# Patient Record
Sex: Male | Born: 1985 | Race: Black or African American | Hispanic: No | Marital: Single | Smoking: Current every day smoker
Health system: Southern US, Community
[De-identification: ages and names within clinical notes are randomized; demographics above are authoritative.]

## PROBLEM LIST (undated history)

## (undated) DIAGNOSIS — M109 Gout, unspecified: Secondary | ICD-10-CM

## (undated) DIAGNOSIS — I10 Essential (primary) hypertension: Secondary | ICD-10-CM

## (undated) DIAGNOSIS — I509 Heart failure, unspecified: Secondary | ICD-10-CM

## (undated) HISTORY — DX: Gout, unspecified: M10.9

## (undated) HISTORY — DX: Heart failure, unspecified: I50.9

---

## 2012-07-05 ENCOUNTER — Encounter (HOSPITAL_BASED_OUTPATIENT_CLINIC_OR_DEPARTMENT_OTHER): Payer: Self-pay | Admitting: *Deleted

## 2012-07-05 ENCOUNTER — Emergency Department (HOSPITAL_BASED_OUTPATIENT_CLINIC_OR_DEPARTMENT_OTHER)
Admission: EM | Admit: 2012-07-05 | Discharge: 2012-07-05 | Disposition: A | Discharge status: Home / Self Care | Payer: Self-pay | Attending: Emergency Medicine | Admitting: Emergency Medicine

## 2012-07-05 DIAGNOSIS — F17200 Nicotine dependence, unspecified, uncomplicated: Secondary | ICD-10-CM | POA: Insufficient documentation

## 2012-07-05 DIAGNOSIS — L0501 Pilonidal cyst with abscess: Secondary | ICD-10-CM | POA: Insufficient documentation

## 2012-07-05 MED ORDER — LIDOCAINE HCL 2 % IJ SOLN
10.0000 mL | Freq: Once | INTRAMUSCULAR | Status: AC
  Administered 2012-07-05: 200 mg via INTRADERMAL
  Filled 2012-07-05: qty 20

## 2012-07-05 MED ORDER — HYDROCODONE-ACETAMINOPHEN 5-500 MG PO TABS: 1.0000 | ORAL_TABLET | Freq: Four times a day (QID) | ORAL | Status: DC | PRN

## 2012-07-05 MED ORDER — CEPHALEXIN 500 MG PO CAPS: 500.0000 mg | ORAL_CAPSULE | Freq: Four times a day (QID) | ORAL | Status: DC

## 2012-07-05 NOTE — ED Notes (Signed)
I & D tray is at the bedside set up and ready for the doctor to use. 

## 2012-07-05 NOTE — ED Provider Notes (Signed)
History     CSN: [id number]  Arrival date & time 07/05/12  1425   First MD Initiated Contact with Patient 07/05/12 1456      Chief Complaint  Patient presents with  . Abscess    (Consider location/radiation/quality/duration/timing/severity/associated sxs/prior treatment) HPI Matthew Buchanan is a 27 y.o. male who presents with complaint of abscess to the lower back area. States history of the same. Has had to have it drained. States this was 'years ago.'  States this time, it began 3 days ago. Worsening daily. No drainage. Tender. No fever, chills, malaise. Worse with palpation. Has not tried anything to make it better.   History reviewed. No pertinent past medical history.  History reviewed. No pertinent past surgical history.  History reviewed. No pertinent family history.  History  Substance Use Topics  . Smoking status: Current Every Day Smoker  . Smokeless tobacco: Not on file  . Alcohol Use: Yes      Review of Systems  Constitutional: Negative for fever and chills.  Respiratory: Negative.   Cardiovascular: Negative.   Skin: Positive for wound.    Allergies  Review of patient's allergies indicates no known allergies.  Home Medications  No current outpatient prescriptions on file.  BP 160/102  Pulse 84  Temp 99 F (37.2 C) (Oral)  Resp 20  SpO2 99%  Physical Exam  Nursing note and vitals reviewed. Constitutional: He appears well-developed and well-nourished. No distress.  Cardiovascular: Normal rate, regular rhythm and normal heart sounds.   Pulmonary/Chest: Effort normal and breath sounds normal. No respiratory distress. He has no wheezes. He has no rales.  Abdominal: Soft. Bowel sounds are normal. He exhibits no distension. There is no tenderness. There is no rebound.  Neurological: He is alert.  Skin: Skin is warm and dry.       3cm polinidal abscess to the lower back, tender to palpation. No drainage.   Psychiatric: He has a normal mood and affect. His  behavior is normal.    ED Course  Procedures (including critical care time)  INCISION AND DRAINAGE Performed by: Jaynie Crumble A Consent: Verbal consent obtained. Risks and benefits: risks, benefits and alternatives were discussed Type: abscess  Body area: pilonidal cyst  Anesthesia: local infiltration  Incision was made with a scalpel.  Local anesthetic: lidocaine 2% wo epinephrine  Anesthetic total: 3 ml  Complexity: complex Blunt dissection to break up loculations  Drainage: purulent  Drainage amount: large  Packing material: 1/4 in iodoform gauze  Patient tolerance: Patient tolerated the procedure well with no immediate complications.      1. Pilonidal abscess       MDM  Pt with recurrent pilonidal abscess. I&Ded in ED. Pt otherwise non toxic. Will bring back in two days for recheck. Keflex for antibiotics. Doubt MRSA.         Lottie Mussel, PA 07/05/12 1649

## 2012-07-05 NOTE — ED Notes (Signed)
Abscess to top of buttocks x 3 days

## 2012-07-06 NOTE — ED Provider Notes (Signed)
Medical screening examination/treatment/procedure(s) were performed by non-physician practitioner and as supervising physician I was immediately available for consultation/collaboration.    Nelia Shi, MD 07/06/12 2231

## 2016-03-22 ENCOUNTER — Emergency Department (HOSPITAL_BASED_OUTPATIENT_CLINIC_OR_DEPARTMENT_OTHER)
Admission: EM | Admit: 2016-03-22 | Discharge: 2016-03-22 | Disposition: A | Discharge status: Home / Self Care | Payer: Self-pay | Attending: Emergency Medicine | Admitting: Emergency Medicine

## 2016-03-22 ENCOUNTER — Encounter (HOSPITAL_BASED_OUTPATIENT_CLINIC_OR_DEPARTMENT_OTHER): Payer: Self-pay | Admitting: Emergency Medicine

## 2016-03-22 DIAGNOSIS — F17290 Nicotine dependence, other tobacco product, uncomplicated: Secondary | ICD-10-CM | POA: Insufficient documentation

## 2016-03-22 DIAGNOSIS — L0501 Pilonidal cyst with abscess: Secondary | ICD-10-CM | POA: Insufficient documentation

## 2016-03-22 MED ORDER — LIDOCAINE HCL 1 % IJ SOLN
20.0000 mL | Freq: Once | INTRAMUSCULAR | Status: AC
  Administered 2016-03-22: 20 mL

## 2016-03-22 MED ORDER — LIDOCAINE HCL (PF) 1 % IJ SOLN
30.0000 mL | Freq: Once | INTRAMUSCULAR | Status: DC
Start: 2016-03-22 — End: 2016-03-22

## 2016-03-22 MED ORDER — CEPHALEXIN 500 MG PO CAPS: 500.0000 mg | ORAL_CAPSULE | Freq: Four times a day (QID) | ORAL | 0 refills | Status: DC

## 2016-03-22 MED ORDER — LIDOCAINE HCL 1 % IJ SOLN
INTRAMUSCULAR | Status: AC
  Administered 2016-03-22: 20 mL
  Filled 2016-03-22: qty 20

## 2016-03-22 NOTE — ED Notes (Signed)
Patient is alert and oriented x3.  He was given DC instructions and follow up visit instructions.  Patient gave verbal understanding.  He was DC ambulatory under his own power to home.  V/S stable.  He was not showing any signs of distress on DC 

## 2016-03-22 NOTE — Discharge Instructions (Signed)
Please take antibiotic as prescribed. Keep wound clean and dressed. Please follow up with your pcp for wound recheck in 2-3 days. Please return to the ED if you develop fever, chills, or if the site becomes red or signs of infection.

## 2016-03-22 NOTE — ED Triage Notes (Signed)
Pt reports abscess on the top of his buttocks. Denies fever or drainage.

## 2016-03-22 NOTE — ED Provider Notes (Signed)
[facility] Provider Note   CSN: [id number] Arrival date & time: 03/22/16    By signing my name below, I, Modena Jansky, attest that this documentation has been prepared under the direction and in the presence of non-physician practitioner, Rise Mu, PA-C. Electronically Signed: Modena Jansky, Scribe. 03/22/2016. 5:11 PM.  History   Chief Complaint Chief Complaint  Patient presents with  . Abscess   The history is provided by the patient. No language interpreter was used.   HPI Comments: Matthew Buchanan is a 30 y.o. male who presents to the Emergency Department complaining of a bump that started 2 days ago. He states that the bump worsened since initial onset and ruptured recently. He reports associated symptoms of pain and swelling to bump site. Patient with history of pilondonial cyst. He has had to have it drained in the past. Started draining prior to arrival.  Tender to palpation or movement. He has tried nothing for the pain. He describes the pain as constant and moderate. He denies any fever, chills, malaise or other complaints.    History reviewed. No pertinent past medical history.  There are no active problems to display for this patient.   History reviewed. No pertinent surgical history.     Home Medications    Prior to Admission medications   Medication Sig Start Date End Date Taking? Authorizing Provider  cephALEXin (KEFLEX) 500 MG capsule Take 1 capsule (500 mg total) by mouth 4 (four) times daily. 07/05/12   Tatyana Kirichenko, PA-C  HYDROcodone-acetaminophen (VICODIN) 5-500 MG per tablet Take 1-2 tablets by mouth every 6 (six) hours as needed for pain. 07/05/12   Jaynie Crumble, PA-C    Family History No family history on file.  Social History Social History  Substance Use Topics  . Smoking status: Current Every Day Smoker    Types: Cigars  . Smokeless tobacco: Never Used  . Alcohol use Yes     Allergies   Review of patient's  allergies indicates no known allergies.   Review of Systems Review of Systems  Constitutional: Negative for chills and fever.  Skin: Positive for wound. Negative for rash.  All other systems reviewed and are negative.    Physical Exam Updated Vital Signs BP 154/100   Pulse 102   Temp 98.5 F (36.9 C) (Oral)   Resp 20   Ht 6' (1.829 m)   Wt 267 lb (121.1 kg)   SpO2 100%   BMI 36.21 kg/m   Physical Exam  Constitutional: He appears well-developed and well-nourished. No distress.  HENT:  Head: Normocephalic and atraumatic.  Eyes: Conjunctivae are normal.  Neck: Neck supple.  Cardiovascular: Normal rate.   Pulmonary/Chest: Effort normal.  Abdominal: Soft.  Musculoskeletal: Normal range of motion.  Neurological: He is alert.  Skin: Skin is warm and dry. Capillary refill takes less than 2 seconds.  3cm polinidal abscess to the lower back at the top of the right gluteal fold, tender to palpation. Purulent drainage noted. No erythema noted. No signs of infection. Induration without fluctuance.   Psychiatric: He has a normal mood and affect.  Nursing note and vitals reviewed.    ED Treatments / Results  DIAGNOSTIC STUDIES: Oxygen Saturation is 100% on RA, normal by my interpretation.    COORDINATION OF CARE: 5:15 PM- Pt advised of plan for treatment and pt agrees.  Labs (all labs ordered are listed, but only abnormal results are displayed) Labs Reviewed - No data to display  EKG  EKG Interpretation  None       Radiology No results found.  Procedures .Marland Kitchen.Incision and Drainage Date/Time: 03/23/2016 2:06 AM Performed by: Demetrios LollLEAPHART, KENNETH T Authorized by: Demetrios LollLEAPHART, KENNETH T   Consent:    Consent obtained:  Verbal   Consent given by:  Patient   Risks discussed:  Bleeding, incomplete drainage, pain and infection   Alternatives discussed:  No treatment Location:    Type:  Pilonidal cyst   Size:  2cm   Location: gluteal fold. Pre-procedure details:    Skin  preparation:  Betadine Anesthesia (see MAR for exact dosages):    Anesthesia method:  Local infiltration   Local anesthetic:  Lidocaine 1% w/o epi Procedure type:    Complexity:  Simple Procedure details:    Needle aspiration: no     Incision types:  Single straight   Incision depth:  Subcutaneous   Scalpel blade:  11   Wound management:  Probed and deloculated and irrigated with saline   Drainage:  Serous (No purluent discharge was expressed)   Drainage amount:  Scant   Wound treatment:  Wound left open   Packing materials:  None Post-procedure details:    Patient tolerance of procedure:  Tolerated well, no immediate complications   (including critical care time)  Medications Ordered in ED Medications  lidocaine (XYLOCAINE) 1 % (with pres) injection 20 mL (20 mLs Infiltration Given by Other 03/22/16 1804)     Initial Impression / Assessment and Plan / ED Course  I have reviewed the triage vital signs and the nursing notes.  Pertinent labs & imaging results that were available during my care of the patient were reviewed by me and considered in my medical decision making (see chart for details).  Clinical Course  Patient with pilodonial cyst amenable to incision and drainage.  Abscess was not large enough to warrant packing or drain,  wound recheck in 2 days. Encouraged home warm soaks and flushing. Started patient on keflex. Doubt MRSA. Patient with drainage prior to arrival. Induration noted. However, unable to express anymore purulent discharge. Patient is afebrile. Will d/c to home. Patient verbalized Understanding the plan of care. Blood pressure slightly elevated in ED. History of same. Patient encouraged follow-up with primary care doctor. Hemodynamically stable. Discharged home in no acute distress stable vital signs.  Final Clinical Impressions(s) / ED Diagnoses   Final diagnoses:  Pilonidal abscess    New Prescriptions Discharge Medication List as of 03/22/2016   6:02 PM     I personally performed the services described in this documentation, which was scribed in my presence. The recorded information has been reviewed and is accurate.     Rise MuKenneth T Leaphart, PA-C 03/23/16 [id number]    Loren Raceravid Yelverton, MD 03/29/16 [phone]

## 2018-10-27 ENCOUNTER — Emergency Department (HOSPITAL_BASED_OUTPATIENT_CLINIC_OR_DEPARTMENT_OTHER): Payer: Self-pay

## 2018-10-27 ENCOUNTER — Other Ambulatory Visit: Payer: Self-pay

## 2018-10-27 ENCOUNTER — Encounter (HOSPITAL_BASED_OUTPATIENT_CLINIC_OR_DEPARTMENT_OTHER): Payer: Self-pay | Admitting: Emergency Medicine

## 2018-10-27 ENCOUNTER — Emergency Department (HOSPITAL_BASED_OUTPATIENT_CLINIC_OR_DEPARTMENT_OTHER)
Admission: EM | Admit: 2018-10-27 | Discharge: 2018-10-27 | Disposition: A | Discharge status: Home / Self Care | Payer: Self-pay | Attending: Emergency Medicine | Admitting: Emergency Medicine

## 2018-10-27 DIAGNOSIS — F17290 Nicotine dependence, other tobacco product, uncomplicated: Secondary | ICD-10-CM | POA: Insufficient documentation

## 2018-10-27 DIAGNOSIS — I10 Essential (primary) hypertension: Secondary | ICD-10-CM | POA: Insufficient documentation

## 2018-10-27 DIAGNOSIS — Z79899 Other long term (current) drug therapy: Secondary | ICD-10-CM | POA: Insufficient documentation

## 2018-10-27 DIAGNOSIS — R079 Chest pain, unspecified: Secondary | ICD-10-CM | POA: Insufficient documentation

## 2018-10-27 LAB — BASIC METABOLIC PANEL
Anion gap: 8 (ref 5–15)
BUN: 11 mg/dL (ref 6–20)
CO2: 26 mmol/L (ref 22–32)
Calcium: 9.1 mg/dL (ref 8.9–10.3)
Chloride: 107 mmol/L (ref 98–111)
Creatinine, Ser: 1.14 mg/dL (ref 0.61–1.24)
GFR calc Af Amer: >60 mL/min (ref >60)
GFR calc non Af Amer: >60 mL/min (ref >60)
Glucose, Bld: 100 mg/dL — ABNORMAL HIGH (ref 70–99)
Potassium: 3.5 mmol/L (ref 3.5–5.1)
Sodium: 141 mmol/L (ref 135–145)

## 2018-10-27 LAB — CBC
HCT: 45.8 % (ref 39.0–52.0)
Hemoglobin: 15.2 g/dL (ref 13.0–17.0)
MCH: 31 pg (ref 26.0–34.0)
MCHC: 33.2 g/dL (ref 30.0–36.0)
MCV: 93.3 fL (ref 80.0–100.0)
Platelets: 212 10*3/uL (ref 150–400)
RBC: 4.91 MIL/uL (ref 4.22–5.81)
RDW: 12.8 % (ref 11.5–15.5)
WBC: 5.3 10*3/uL (ref 4.0–10.5)
nRBC: 0 % (ref 0.0–0.2)

## 2018-10-27 LAB — CBG MONITORING, ED: Glucose-Capillary: 97 mg/dL (ref 70–99)

## 2018-10-27 LAB — TROPONIN I: Troponin I: <0.03 ng/mL (ref <0.03)

## 2018-10-27 MED ORDER — ASPIRIN 81 MG PO CHEW
324.0000 mg | CHEWABLE_TABLET | Freq: Once | ORAL | Status: AC
  Administered 2018-10-27: 324 mg via ORAL
  Filled 2018-10-27: qty 4

## 2018-10-27 MED ORDER — HYDROCHLOROTHIAZIDE 25 MG PO TABS: 25.0000 mg | ORAL_TABLET | Freq: Every day | ORAL | 0 refills | Status: DC

## 2018-10-27 MED ORDER — ALBUTEROL SULFATE HFA 108 (90 BASE) MCG/ACT IN AERS
2.0000 | INHALATION_SPRAY | Freq: Once | RESPIRATORY_TRACT | Status: AC
  Administered 2018-10-27: 2 via RESPIRATORY_TRACT
  Filled 2018-10-27: qty 6.7

## 2018-10-27 NOTE — ED Notes (Signed)
ED Provider at bedside. 

## 2018-10-27 NOTE — Discharge Instructions (Addendum)
Start taking medication for your blood pressure.  Follow-up with a primary care doctor to have that rechecked.  Return to the emergency room for any worsening symptoms.

## 2018-10-27 NOTE — ED Triage Notes (Signed)
Pt c/o mid sternal chest pain and upper back pain x 3/4 days. Pt also c/o sob. Denies fever, N/V/D

## 2018-10-27 NOTE — ED Notes (Signed)
X-ray at bedside

## 2018-10-27 NOTE — ED Notes (Signed)
Pt instructed on albuterol  Inhaler. Pt verbalized understanding. Pt monitored when taking medication.

## 2018-10-27 NOTE — ED Notes (Signed)
Pt c/o central chest pain with radiation to back x 3/4 days. Denies fever, N/V/D

## 2018-10-27 NOTE — ED Provider Notes (Signed)
[facility] EMERGENCY DEPARTMENT Provider Note   CSN: [id number] Arrival date & time: 10/27/18  1041    History   Chief Complaint Chief Complaint  Patient presents with  . Chest Pain    HPI Temitayo Covalt is a 33 y.o. male.  HPI: A 33 year old patient with a history of hypertension presents for evaluation of chest pain. Initial onset of pain was more than 6 hours ago. The patient's chest pain is sharp and is not worse with exertion. The patient's chest pain is middle- or left-sided, is not well-localized, is not described as heaviness/pressure/tightness and does not radiate to the arms/jaw/neck. The patient does not complain of nausea and denies diaphoresis. The patient has smoked in the past 90 days. The patient has no history of stroke, has no history of peripheral artery disease, denies any history of treated diabetes, has no relevant family history of coronary artery disease (first degree relative at less than age 32), has no history of hypercholesterolemia and does not have an elevated BMI (>=30).   HPI Patient presents the emergency room for evaluation of chest pain.  Patient states started having pain in his chest yesterday.  Is a sharp pain that increases with deep breathing and it does radiate to his back.  He does not have any history of heart disease.  No history of PE or DVT.  Patient does smoke.  He has been told that he had elevated blood pressure in the past.  Patient states he was given a prescription.  He states he never went back to see another doctor after the prescription ran out. History reviewed. No pertinent past medical history.  There are no active problems to display for this patient.   History reviewed. No pertinent surgical history.      Home Medications    Prior to Admission medications   Medication Sig Start Date End Date Taking? Authorizing Provider  cephALEXin (KEFLEX) 500 MG capsule Take 1 capsule (500 mg total) by mouth 4 (four) times daily.  03/22/16   Rise Mu, PA-C  hydrochlorothiazide (HYDRODIURIL) 25 MG tablet Take 1 tablet (25 mg total) by mouth daily. 10/27/18   Linwood Dibbles, MD  HYDROcodone-acetaminophen (VICODIN) 5-500 MG per tablet Take 1-2 tablets by mouth every 6 (six) hours as needed for pain. 07/05/12   Jaynie Crumble, PA-C    Family History History reviewed. No pertinent family history.  Social History Social History   Tobacco Use  . Smoking status: Current Every Day Smoker    Types: Cigars  . Smokeless tobacco: Never Used  Substance Use Topics  . Alcohol use: Yes  . Drug use: Yes    Types: Marijuana     Allergies   Patient has no known allergies.   Review of Systems Review of Systems  All other systems reviewed and are negative.    Physical Exam Updated Vital Signs BP (!) 146/93 (BP Location: Left Arm)   Pulse 60   Resp 18   Ht 1.829 m (6')   Wt 119.7 kg   SpO2 98%   BMI 35.80 kg/m   Physical Exam Vitals signs and nursing note reviewed.  Constitutional:      General: He is not in acute distress.    Appearance: He is well-developed.  HENT:     Head: Normocephalic and atraumatic.     Right Ear: External ear normal.     Left Ear: External ear normal.  Eyes:     General: No scleral icterus.  Right eye: No discharge.        Left eye: No discharge.     Conjunctiva/sclera: Conjunctivae normal.  Neck:     Musculoskeletal: Neck supple.     Trachea: No tracheal deviation.  Cardiovascular:     Rate and Rhythm: Normal rate and regular rhythm.  Pulmonary:     Effort: Pulmonary effort is normal. No respiratory distress.     Breath sounds: No stridor. Wheezing present. No rales.  Abdominal:     General: Bowel sounds are normal. There is no distension.     Palpations: Abdomen is soft.     Tenderness: There is no abdominal tenderness. There is no guarding or rebound.  Musculoskeletal:        General: No tenderness.  Skin:    General: Skin is warm and dry.      Findings: No rash.  Neurological:     Mental Status: He is alert.     Cranial Nerves: No cranial nerve deficit (no facial droop, extraocular movements intact, no slurred speech).     Sensory: No sensory deficit.     Motor: No abnormal muscle tone or seizure activity.     Coordination: Coordination normal.      ED Treatments / Results  Labs (all labs ordered are listed, but only abnormal results are displayed) Labs Reviewed  BASIC METABOLIC PANEL - Abnormal; Notable for the following components:      Result Value   Glucose, Bld 100 (*)    All other components within normal limits  TROPONIN I  CBC  TROPONIN I  CBG MONITORING, ED    EKG EKG Interpretation  Date/Time:  Tuesday Oct 27 2018 10:55:09 EDT Ventricular Rate:  85 PR Interval:    QRS Duration: 88 QT Interval:  383 QTC Calculation: 456 R Axis:   27 Text Interpretation:  Sinus rhythm Abnormal T, consider ischemia, diffuse leads Minimal ST elevation, anterior leads No old tracing to compare Confirmed by Linwood Dibbles [phone]) on 10/27/2018 10:58:05 AM   Radiology Dg Chest Portable 1 View  Result Date: 10/27/2018 CLINICAL DATA:  Midsternal chest pain. EXAM: PORTABLE CHEST 1 VIEW FINDINGS: The heart size and mediastinal contours are within normal limits. Both lungs are clear. The visualized skeletal structures are unremarkable. IMPRESSION: No active disease. Electronically Signed   By: Elsie Stain M.D.   On: 10/27/2018 11:19    Procedures Procedures (including critical care time)  Medications Ordered in ED Medications  aspirin chewable tablet 324 mg (324 mg Oral Given 10/27/18 1147)  albuterol (VENTOLIN HFA) 108 (90 Base) MCG/ACT inhaler 2 puff (2 puffs Inhalation Given 10/27/18 1146)     Initial Impression / Assessment and Plan / ED Course  I have reviewed the triage vital signs and the nursing notes.  Pertinent labs & imaging results that were available during my care of the patient were reviewed by me and  considered in my medical decision making (see chart for details).  Clinical Course as of Oct 27 1502  Tue Oct 27, 2018  1410 Low risk for PE.  Perc negative.   [JK]  1500 Plan was for delta troponin.  Pt has to leave and pick up his daughter.   [JK]    Clinical Course User Index [JK] Linwood Dibbles, MD    HEAR Score: 2Patient presented to the emergency room for evaluation of chest pain.  Patient is low risk for heart disease.  Initial troponin was negative.  I recommended a delta troponin but the patient  had to leave.  Overall I have low suspicion as the symptoms did start more than 24 hours ago.  Patient symptoms are more pleuritic in nature but he is not short of breath and is PERC negative.  I doubt pulmonary embolism.  Symptoms may be related to a viral pleurisy.  Patient was noted to be hypertensive.  I will discharge home on a low-dose antihypertensive agent.  Discussed outpatient follow-up with her primary care doctor.  Final Clinical Impressions(s) / ED Diagnoses   Final diagnoses:  Chest pain, unspecified type  Hypertension, unspecified type    ED Discharge Orders         Ordered    hydrochlorothiazide (HYDRODIURIL) 25 MG tablet  Daily     10/27/18 1504           Linwood DibblesKnapp, Ashantae Pangallo, MD 10/27/18 1504

## 2018-10-27 NOTE — ED Notes (Signed)
cbg 97 

## 2020-06-03 HISTORY — PX: CHOLECYSTECTOMY: SHX55

## 2021-01-11 IMAGING — DX PORTABLE CHEST - 1 VIEW
1 series · 1 of 1 positions shown · non-contrast
Comparison: none

CLINICAL DATA: Midsternal chest pain.

EXAM:
PORTABLE CHEST 1 VIEW

[chest ap]
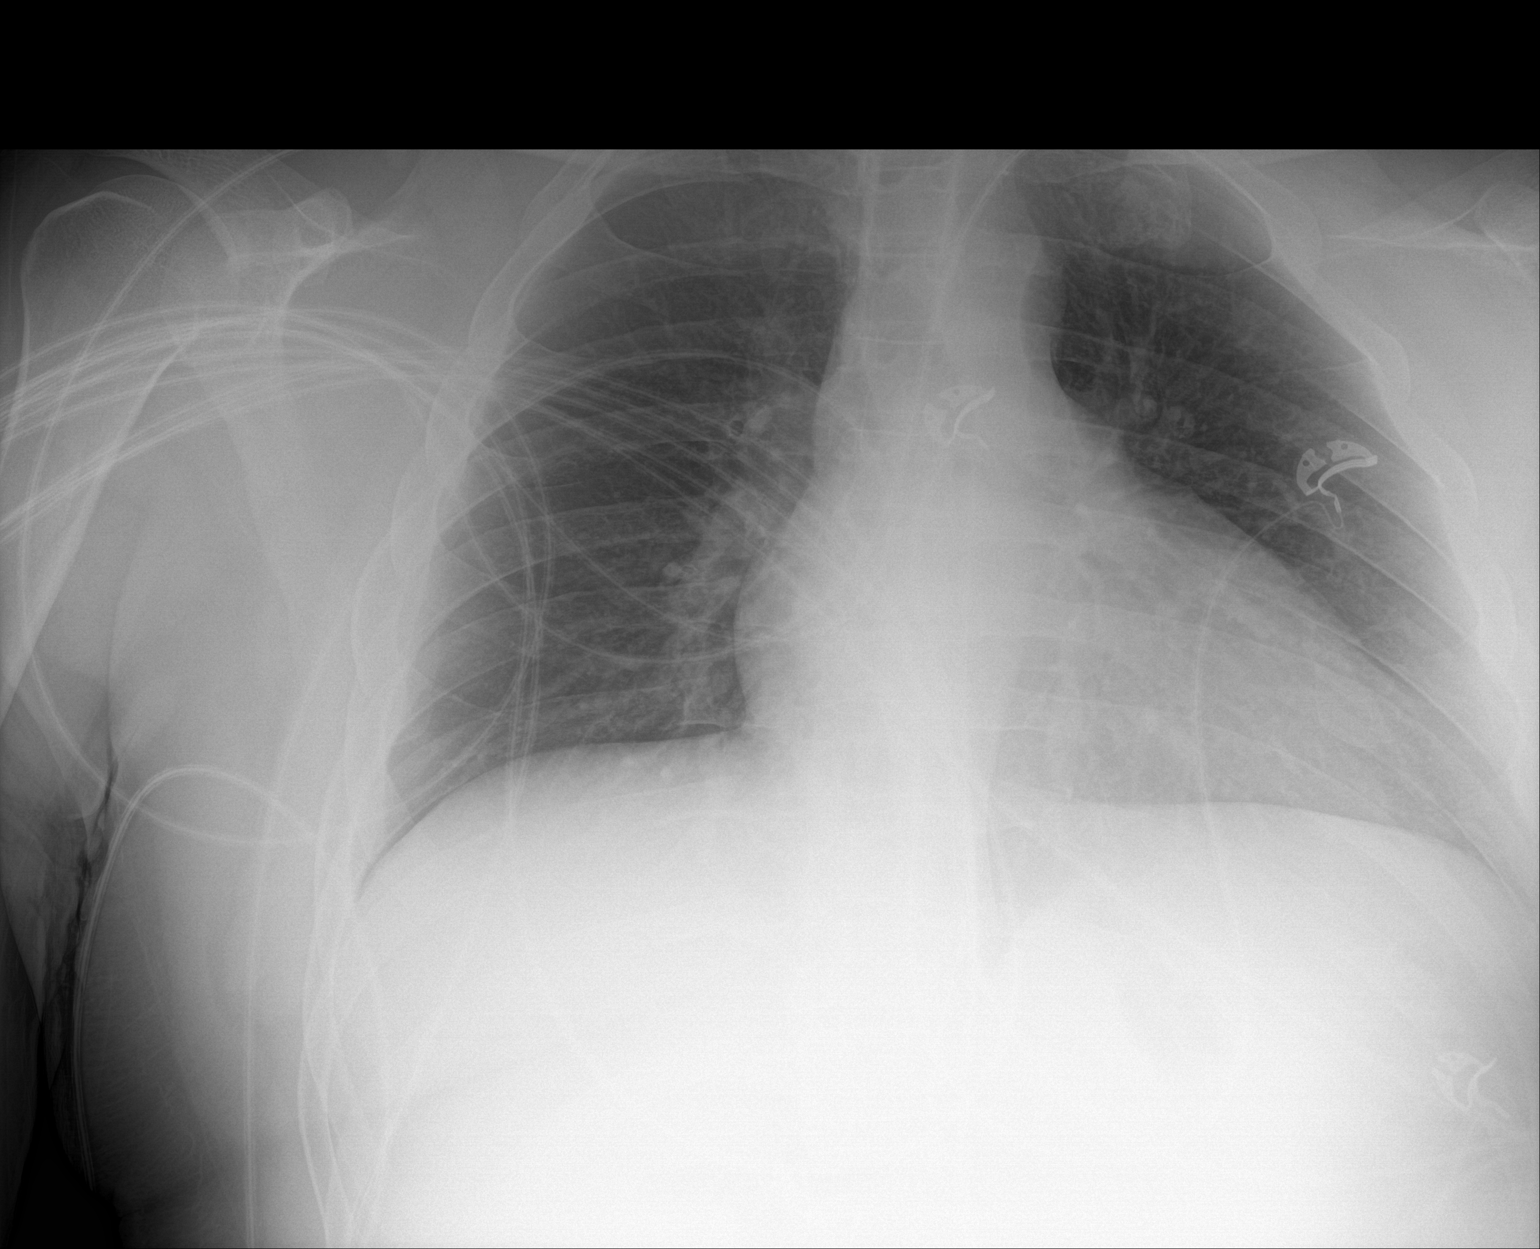

[1 of 1 positions shown; findings below may reference images not displayed]

FINDINGS: The heart size and mediastinal contours are within normal limits.
Both lungs are clear. The visualized skeletal structures are
unremarkable.
IMPRESSION: No active disease.

## 2022-11-19 ENCOUNTER — Encounter (HOSPITAL_BASED_OUTPATIENT_CLINIC_OR_DEPARTMENT_OTHER): Payer: Self-pay | Admitting: Urology

## 2022-11-19 ENCOUNTER — Other Ambulatory Visit: Payer: Self-pay

## 2022-11-19 ENCOUNTER — Emergency Department (HOSPITAL_BASED_OUTPATIENT_CLINIC_OR_DEPARTMENT_OTHER)
Admission: EM | Admit: 2022-11-19 | Discharge: 2022-11-19 | Disposition: A | Discharge status: Home / Self Care | Payer: 59 | Attending: Emergency Medicine | Admitting: Emergency Medicine

## 2022-11-19 ENCOUNTER — Emergency Department (HOSPITAL_BASED_OUTPATIENT_CLINIC_OR_DEPARTMENT_OTHER): Payer: 59

## 2022-11-19 DIAGNOSIS — S3992XA Unspecified injury of lower back, initial encounter: Secondary | ICD-10-CM | POA: Insufficient documentation

## 2022-11-19 DIAGNOSIS — I509 Heart failure, unspecified: Secondary | ICD-10-CM | POA: Diagnosis not present

## 2022-11-19 DIAGNOSIS — W19XXXA Unspecified fall, initial encounter: Secondary | ICD-10-CM | POA: Diagnosis not present

## 2022-11-19 DIAGNOSIS — M533 Sacrococcygeal disorders, not elsewhere classified: Secondary | ICD-10-CM | POA: Diagnosis present

## 2022-11-19 MED ORDER — HYDROCODONE-ACETAMINOPHEN 5-325 MG PO TABS: 1.0000 | ORAL_TABLET | Freq: Four times a day (QID) | ORAL | 0 refills | Status: DC | PRN

## 2022-11-19 MED ORDER — KETOROLAC TROMETHAMINE 15 MG/ML IJ SOLN
15.0000 mg | Freq: Once | INTRAMUSCULAR | Status: AC
  Administered 2022-11-19: 15 mg via INTRAMUSCULAR
  Filled 2022-11-19: qty 1

## 2022-11-19 NOTE — ED Provider Notes (Signed)
[place] [facility] Provider Note   CSN: [id number] Arrival date & time: 11/19/22  1334     History  Chief Complaint  Patient presents with   Tailbone Pain    Money Etling is a 37 y.o. male.  Patient presents to the emergency department today for evaluation of tailbone pain.  Patient had a fall onto his bottom about 1 week ago.  Since that time he has had pain with bending and with sitting.  He has a history of congestive heart failure.  No anticoagulation.  Denies treatments prior to arrival.  No weakness, numbness, or tingling in the legs.  He is ambulatory.  He is able to sit but it is very uncomfortable.       Home Medications Prior to Admission medications   Medication Sig Start Date End Date Taking? Authorizing Provider  HYDROcodone-acetaminophen (NORCO/VICODIN) 5-325 MG tablet Take 1 tablet by mouth every 6 (six) hours as needed for severe pain. 11/19/22  Yes Renne Crigler, PA-C  albuterol (VENTOLIN HFA) 108 (90 Base) MCG/ACT inhaler Inhale 2 puffs into the lungs every 6 (six) hours as needed for wheezing or shortness of breath.    [provider]  cephALEXin (KEFLEX) 500 MG capsule Take 1 capsule (500 mg total) by mouth 4 (four) times daily. 03/22/16   Rise Mu, PA-C  hydrochlorothiazide (HYDRODIURIL) 25 MG tablet Take 1 tablet (25 mg total) by mouth daily. 10/27/18   Linwood Dibbles, MD      Allergies    Patient has no known allergies.    Review of Systems   Review of Systems  Physical Exam Updated Vital Signs BP (!) 158/102 (BP Location: Left Arm)   Pulse 87   Temp 97.8 F (36.6 C)   Resp 18   Ht 6' (1.829 m)   Wt (!) 137.4 kg   SpO2 98%   BMI 41.09 kg/m  Physical Exam Vitals and nursing note reviewed.  Constitutional:      Appearance: He is well-developed.  HENT:     Head: Normocephalic and atraumatic.  Eyes:     Conjunctiva/sclera: Conjunctivae normal.  Pulmonary:     Effort: No respiratory  distress.  Musculoskeletal:     Cervical back: Normal range of motion and neck supple.     Lumbar back: Tenderness present.       Back:     Comments: Patient with tenderness to palpation over the coccygeal area.  No signs of cyst or abscess.  No skin changes.  No palpable deformity.  Skin:    General: Skin is warm and dry.  Neurological:     Mental Status: He is alert.     ED Results / Procedures / Treatments   Labs (all labs ordered are listed, but only abnormal results are displayed) Labs Reviewed - No data to display  EKG None  Radiology DG Sacrum/Coccyx  Result Date: 11/19/2022 CLINICAL DATA:  Trauma, fall, pain EXAM: SACRUM AND COCCYX - 2+ VIEW COMPARISON:  None Available. FINDINGS: No fracture is seen.  SI joints are symmetrical. IMPRESSION: No fracture is seen. Electronically Signed   By: Ernie Avena M.D.   On: 11/19/2022 15:15    Procedures Procedures    Medications Ordered in ED Medications  ketorolac (TORADOL) 15 MG/ML injection 15 mg (has no administration in time range)    ED Course/ Medical Decision Making/ A&P    Patient seen and examined. History obtained directly from patient. Work-up including labs, imaging, EKG  ordered in triage, if performed, were reviewed.    Labs/EKG: None ordered  Imaging: Independently reviewed and interpreted.  This included: Sacral/coccyx ischial plain films ordered in triage, agree negative.  Medications/Fluids: Will give 1 dose of 15 mg IV Toradol here (creatinine 1.05 on 06/12/2022).  I would like to avoid long-term NSAIDs due to his CHF history.  He will be given pain medication for home.  Most recent vital signs reviewed and are as follows: BP (!) 158/102 (BP Location: Left Arm)   Pulse 87   Temp 97.8 F (36.6 C)   Resp 18   Ht 6' (1.829 m)   Wt (!) 137.4 kg   SpO2 98%   BMI 41.09 kg/m   Initial impression: Coccygeal pain after fall  Home treatment plan: Ice and heat on the area, donut pillow, p.o.  Tylenol for mild pain.  Will prescribe # 5 tablets of Vicodin 5 mg - 325 mg for more severe pain.  Advised to use at bedtime.  Patient counseled on use of narcotic pain medications. Counseled not to combine these medications with others containing tylenol. Urged not to drink alcohol, drive, or perform any other activities that requires focus while taking these medications. The patient verbalizes understanding and agrees with the plan.  Return instructions discussed with patient: New or worsening symptoms  Follow-up instructions discussed with patient: PCP in 1 week for recheck                            Medical Decision Making Amount and/or Complexity of Data Reviewed Radiology: ordered.  Risk Prescription drug management.   Patient with coccygeal pain.  X-rays negative.  No sign of gluteal cleft cyst or pilonidal cyst.  Patient is ambulatory.  Lower extremities are neurovascularly intact.  He is not a great candidate for long-term NSAIDs due to his heart failure history.  He was given a IM dose of Toradol here and will be discharged home with some pain medication.  The patient's vital signs, pertinent lab work and imaging were reviewed and interpreted as discussed in the ED course. Hospitalization was considered for further testing, treatments, or serial exams/observation. However as patient is well-appearing, has a stable exam, and reassuring studies today, I do not feel that they warrant admission at this time. This plan was discussed with the patient who verbalizes agreement and comfort with this plan and seems reliable and able to return to the Emergency Department with worsening or changing symptoms.          Final Clinical Impression(s) / ED Diagnoses Final diagnoses:  Injury of coccyx, initial encounter    Rx / DC Orders ED Discharge Orders          Ordered    HYDROcodone-acetaminophen (NORCO/VICODIN) 5-325 MG tablet  Every 6 hours PRN        11/19/22 1605               Renne Crigler, PA-C 11/19/22 1610    Tegeler, Canary Brim, MD 11/19/22 1730

## 2022-11-19 NOTE — ED Notes (Signed)
Patient transported to X-ray 

## 2022-11-19 NOTE — Discharge Instructions (Signed)
Please read and follow all provided instructions.  Your diagnoses today include:  1. Injury of coccyx, initial encounter    Tests performed today include: X-ray of the sacrum and coccyx did not show any definite fractures Vital signs. See below for your results today.   Medications prescribed:  Vicodin (hydrocodone/acetaminophen) - narcotic pain medication  DO NOT drive or perform any activities that require you to be awake and alert because this medicine can make you drowsy. BE VERY CAREFUL not to take multiple medicines containing Tylenol (also called acetaminophen). Doing so can lead to an overdose which can damage your liver and cause liver failure and possibly death.  Take any prescribed medications only as directed.  Home care instructions:  Follow any educational materials contained in this packet.  BE VERY CAREFUL not to take multiple medicines containing Tylenol (also called acetaminophen). Doing so can lead to an overdose which can damage your liver and cause liver failure and possibly death.   Follow-up instructions: Please follow-up with your primary care provider in the next 7 days for further evaluation of your symptoms.   Return instructions:  Please return to the Emergency Department if you experience worsening symptoms.  Please return if you have any other emergent concerns.  Additional Information:  Your vital signs today were: BP (!) 158/102 (BP Location: Left Arm)   Pulse 87   Temp 97.8 F (36.6 C)   Resp 18   Ht 6' (1.829 m)   Wt (!) 137.4 kg   SpO2 98%   BMI 41.09 kg/m  If your blood pressure (BP) was elevated above 135/85 this visit, please have this repeated by your doctor within one month. --------------

## 2022-11-19 NOTE — ED Triage Notes (Signed)
Pt states coccyx pain after fall 1 week ago  Pain worse with sitting

## 2023-01-06 ENCOUNTER — Emergency Department (HOSPITAL_BASED_OUTPATIENT_CLINIC_OR_DEPARTMENT_OTHER)
Admission: EM | Admit: 2023-01-06 | Discharge: 2023-01-06 | Disposition: A | Discharge status: Home / Self Care | Payer: 59 | Attending: Emergency Medicine | Admitting: Emergency Medicine

## 2023-01-06 ENCOUNTER — Emergency Department (HOSPITAL_BASED_OUTPATIENT_CLINIC_OR_DEPARTMENT_OTHER): Payer: 59

## 2023-01-06 ENCOUNTER — Encounter (HOSPITAL_BASED_OUTPATIENT_CLINIC_OR_DEPARTMENT_OTHER): Payer: Self-pay

## 2023-01-06 ENCOUNTER — Other Ambulatory Visit: Payer: Self-pay

## 2023-01-06 DIAGNOSIS — M25571 Pain in right ankle and joints of right foot: Secondary | ICD-10-CM | POA: Diagnosis present

## 2023-01-06 DIAGNOSIS — X58XXXA Exposure to other specified factors, initial encounter: Secondary | ICD-10-CM | POA: Insufficient documentation

## 2023-01-06 DIAGNOSIS — S93491A Sprain of other ligament of right ankle, initial encounter: Secondary | ICD-10-CM | POA: Insufficient documentation

## 2023-01-06 HISTORY — DX: Essential (primary) hypertension: I10

## 2023-01-06 MED ORDER — KETOROLAC TROMETHAMINE 15 MG/ML IJ SOLN
15.0000 mg | Freq: Once | INTRAMUSCULAR | Status: AC
  Administered 2023-01-06: 15 mg via INTRAMUSCULAR
  Filled 2023-01-06: qty 1

## 2023-01-06 NOTE — Discharge Instructions (Signed)
There is no obvious broken bone on your x-ray.  Please try and keep your weight off of it at all times.  Please follow-up with your family doctor in the office.  Take 4 over the counter ibuprofen tablets 3 times a day or 2 over-the-counter naproxen tablets twice a day for pain. Also take tylenol 1000mg (2 extra strength) four times a day.

## 2023-01-06 NOTE — ED Triage Notes (Signed)
Pt states he woke up Sunday morning with rt. Foot swelling.   Denies any injury Can not bear weight on foot

## 2023-01-06 NOTE — ED Notes (Signed)
Pt refused crutches states he has a pair at home. RN Drinda Butts informed

## 2023-01-06 NOTE — ED Provider Notes (Signed)
[place] [facility] Provider Note   CSN: [id number] Arrival date & time: 01/06/23  0008     History  Chief Complaint  Patient presents with   rt. foot swelling    Worden Schults is a 37 y.o. male.  37 yo M with a chief complaints of right ankle pain.  This was noticed yesterday morning after he woke up.  Denies any obvious injury.  Pain with bearing weight.  Denies obvious trauma.  No pain to the knee.        Home Medications Prior to Admission medications   Medication Sig Start Date End Date Taking? Authorizing Provider  albuterol (VENTOLIN HFA) 108 (90 Base) MCG/ACT inhaler Inhale 2 puffs into the lungs every 6 (six) hours as needed for wheezing or shortness of breath.    [provider]  cephALEXin (KEFLEX) 500 MG capsule Take 1 capsule (500 mg total) by mouth 4 (four) times daily. 03/22/16   Rise Mu, PA-C  hydrochlorothiazide (HYDRODIURIL) 25 MG tablet Take 1 tablet (25 mg total) by mouth daily. 10/27/18   Linwood Dibbles, MD  HYDROcodone-acetaminophen (NORCO/VICODIN) 5-325 MG tablet Take 1 tablet by mouth every 6 (six) hours as needed for severe pain. 11/19/22   Renne Crigler, PA-C      Allergies    Patient has no known allergies.    Review of Systems   Review of Systems  Physical Exam Updated Vital Signs BP (!) 145/93 (BP Location: Left Arm)   Pulse 86   Temp 97.9 F (36.6 C)   Resp 18   Ht 6' (1.829 m)   Wt 128.8 kg   SpO2 95%   BMI 38.52 kg/m  Physical Exam Vitals and nursing note reviewed.  Constitutional:      Appearance: He is well-developed.  HENT:     Head: Normocephalic and atraumatic.  Eyes:     Pupils: Pupils are equal, round, and reactive to light.  Neck:     Vascular: No JVD.  Cardiovascular:     Rate and Rhythm: Normal rate and regular rhythm.     Heart sounds: No murmur heard.    No friction rub. No gallop.  Pulmonary:     Effort: No respiratory distress.     Breath sounds: No  wheezing.  Abdominal:     General: There is no distension.     Tenderness: There is no abdominal tenderness. There is no guarding or rebound.  Musculoskeletal:        General: Swelling and tenderness present. Normal range of motion.     Cervical back: Normal range of motion and neck supple.     Comments: Pain and swelling to the right ankle about the anterior tib fibular ligament.  No obvious pain about the medial aspect of the foot or ankle.  No pain about the navicular at the base of the fifth metatarsal.  Pulse motor and sensation intact.  Skin:    Coloration: Skin is not pale.     Findings: No rash.  Neurological:     Mental Status: He is alert and oriented to person, place, and time.  Psychiatric:        Behavior: Behavior normal.     ED Results / Procedures / Treatments   Labs (all labs ordered are listed, but only abnormal results are displayed) Labs Reviewed - No data to display  EKG None  Radiology No results found.  Procedures Procedures    Medications Ordered in ED Medications  ketorolac (TORADOL) 15 MG/ML injection 15 mg (15 mg Intramuscular Given 01/06/23 0035)    ED Course/ Medical Decision Making/ A&P                                 Medical Decision Making Amount and/or Complexity of Data Reviewed Radiology: ordered.  Risk Prescription drug management.   37 yo M with a chief complaints of right ankle pain.  Most likely the patient has an ankle sprain based on exam.  He has no obvious history of ankle injury that he can remember.  Will obtain a plain film.  Unlikely to be septic arthritis or gout based on exam and history.  Plain film of the ankle independently interpreted by me without fracture.  Will place in a splint.  Crutches.  PCP follow-up.  1:17 AM:  I have discussed the diagnosis/risks/treatment options with the patient and family.  Evaluation and diagnostic testing in the emergency department does not suggest an emergent condition requiring  admission or immediate intervention beyond what has been performed at this time.  They will follow up with PCP. We also discussed returning to the ED immediately if new or worsening sx occur. We discussed the sx which are most concerning (e.g., sudden worsening pain, fever, inability to tolerate by mouth) that necessitate immediate return. Medications administered to the patient during their visit and any new prescriptions provided to the patient are listed below.  Medications given during this visit Medications  ketorolac (TORADOL) 15 MG/ML injection 15 mg (15 mg Intramuscular Given 01/06/23 0035)     The patient appears reasonably screen and/or stabilized for discharge and I doubt any other medical condition or other [facility] requiring further screening, evaluation, or treatment in the ED at this time prior to discharge.          Final Clinical Impression(s) / ED Diagnoses Final diagnoses:  Sprain of anterior talofibular ligament of right ankle, initial encounter    Rx / DC Orders ED Discharge Orders     None         Melene Plan, DO 01/06/23 0117

## 2023-04-11 ENCOUNTER — Other Ambulatory Visit: Payer: Self-pay

## 2023-04-11 ENCOUNTER — Encounter (HOSPITAL_BASED_OUTPATIENT_CLINIC_OR_DEPARTMENT_OTHER): Payer: Self-pay | Admitting: Emergency Medicine

## 2023-04-11 DIAGNOSIS — R519 Headache, unspecified: Secondary | ICD-10-CM | POA: Diagnosis present

## 2023-04-11 MED ORDER — IBUPROFEN 800 MG PO TABS
800.0000 mg | ORAL_TABLET | Freq: Once | ORAL | Status: AC | PRN
  Administered 2023-04-11: 800 mg via ORAL
  Filled 2023-04-11: qty 1

## 2023-04-11 NOTE — ED Triage Notes (Signed)
Pt states headache X 5 days, seen at [facility] and treated and it helped but came back. Denies injury or Hx of same. States may be blood pressure.

## 2023-04-12 ENCOUNTER — Emergency Department (HOSPITAL_BASED_OUTPATIENT_CLINIC_OR_DEPARTMENT_OTHER)
Admission: EM | Admit: 2023-04-12 | Discharge: 2023-04-12 | Disposition: A | Discharge status: Home / Self Care | Payer: 59 | Attending: Emergency Medicine | Admitting: Emergency Medicine

## 2023-04-12 DIAGNOSIS — R519 Headache, unspecified: Secondary | ICD-10-CM

## 2023-04-12 MED ORDER — DIPHENHYDRAMINE HCL 50 MG/ML IJ SOLN
25.0000 mg | Freq: Once | INTRAMUSCULAR | Status: AC
  Administered 2023-04-12: 25 mg via INTRAVENOUS
  Filled 2023-04-12: qty 1

## 2023-04-12 MED ORDER — PROCHLORPERAZINE EDISYLATE 10 MG/2ML IJ SOLN
10.0000 mg | Freq: Once | INTRAMUSCULAR | Status: AC
  Administered 2023-04-12: 10 mg via INTRAVENOUS
  Filled 2023-04-12: qty 2

## 2023-04-12 MED ORDER — SODIUM CHLORIDE 0.9 % IV BOLUS
1000.0000 mL | Freq: Once | INTRAVENOUS | Status: AC
  Administered 2023-04-12: 1000 mL via INTRAVENOUS

## 2023-04-12 MED ORDER — KETOROLAC TROMETHAMINE 30 MG/ML IJ SOLN
30.0000 mg | Freq: Once | INTRAMUSCULAR | Status: AC
  Administered 2023-04-12: 30 mg via INTRAVENOUS
  Filled 2023-04-12: qty 1

## 2023-04-12 MED ORDER — DROPERIDOL 2.5 MG/ML IJ SOLN
2.5000 mg | Freq: Once | INTRAMUSCULAR | Status: AC
  Administered 2023-04-12: 2.5 mg via INTRAVENOUS
  Filled 2023-04-12: qty 2

## 2023-04-12 NOTE — Discharge Instructions (Signed)
Take Tylenol 1000 mg rotated with ibuprofen 600 mg every 4 hours as needed for pain.  Follow-up with primary doctor if symptoms persist, and return to the ER if symptoms significantly worsen or change. 

## 2023-04-12 NOTE — ED Provider Notes (Signed)
[place] [facility] Provider Note   CSN: [id number] Arrival date & time: 04/11/23  2222     History  Chief Complaint  Patient presents with   Headache    Matthew Buchanan is a 37 y.o. male.  Patient is a 37 year old male presenting with complaints of headache.  He has been having headaches off and on all week.  He was seen at [facility] several days ago and had a head CT and COVID test done, but both were negative.  He was given a migraine cocktail with good relief.  His headache has since returned.  He denies any fevers or chills.  No stiff neck.  No ill contacts.  He denies prior history of migraine.  The history is provided by the patient.       Home Medications Prior to Admission medications   Medication Sig Start Date End Date Taking? Authorizing Provider  albuterol (VENTOLIN HFA) 108 (90 Base) MCG/ACT inhaler Inhale 2 puffs into the lungs every 6 (six) hours as needed for wheezing or shortness of breath.    [provider]  cephALEXin (KEFLEX) 500 MG capsule Take 1 capsule (500 mg total) by mouth 4 (four) times daily. 03/22/16   Rise Mu, PA-C  hydrochlorothiazide (HYDRODIURIL) 25 MG tablet Take 1 tablet (25 mg total) by mouth daily. 10/27/18   Linwood Dibbles, MD  HYDROcodone-acetaminophen (NORCO/VICODIN) 5-325 MG tablet Take 1 tablet by mouth every 6 (six) hours as needed for severe pain. 11/19/22   Renne Crigler, PA-C      Allergies    Patient has no known allergies.    Review of Systems   Review of Systems  All other systems reviewed and are negative.   Physical Exam Updated Vital Signs BP 127/81 (BP Location: Left Arm)   Pulse 77   Temp 98.8 F (37.1 C) (Oral)   Resp 18   Ht 6' (1.829 m)   Wt 119.7 kg   SpO2 97%   BMI 35.80 kg/m  Physical Exam Vitals and nursing note reviewed.  Constitutional:      General: He is not in acute distress.    Appearance: He is well-developed. He is not diaphoretic.   HENT:     Head: Normocephalic and atraumatic.  Eyes:     Extraocular Movements: Extraocular movements intact.     Pupils: Pupils are equal, round, and reactive to light.  Cardiovascular:     Rate and Rhythm: Normal rate and regular rhythm.     Heart sounds: No murmur heard.    No friction rub.  Pulmonary:     Effort: Pulmonary effort is normal. No respiratory distress.     Breath sounds: Normal breath sounds. No wheezing or rales.  Abdominal:     General: Bowel sounds are normal. There is no distension.     Palpations: Abdomen is soft.     Tenderness: There is no abdominal tenderness.  Musculoskeletal:        General: Normal range of motion.     Cervical back: Normal range of motion and neck supple. No rigidity.  Lymphadenopathy:     Cervical: No cervical adenopathy.  Skin:    General: Skin is warm and dry.  Neurological:     Mental Status: He is alert and oriented to person, place, and time.     Cranial Nerves: No cranial nerve deficit, dysarthria or facial asymmetry.     Coordination: Coordination normal.     ED Results /  Procedures / Treatments   Labs (all labs ordered are listed, but only abnormal results are displayed) Labs Reviewed - No data to display  EKG None  Radiology No results found.  Procedures Procedures    Medications Ordered in ED Medications  sodium chloride 0.9 % bolus 1,000 mL (has no administration in time range)  prochlorperazine (COMPAZINE) injection 10 mg (has no administration in time range)  ketorolac (TORADOL) 30 MG/ML injection 30 mg (has no administration in time range)  diphenhydrAMINE (BENADRYL) injection 25 mg (has no administration in time range)  ibuprofen (ADVIL) tablet 800 mg (800 mg Oral Given 04/11/23 2241)    ED Course/ Medical Decision Making/ A&P  Patient presenting here with complaints of headache as described in the HPI.  Patient arrives here with stable vital signs and is afebrile.  He is neurologically intact and  there is no nuchal rigidity.  Patient given migraine cocktail consisting of Compazine, Toradol, and Benadryl, but this did not seem to help.  This was followed with a dose of droperidol and he is now resting comfortably and feels markedly improved.  At this point, I feel as though patient can safely be discharged.  He had a head CT performed 3 days ago at [facility] and I see no indication to repeat this.  There are also no red flags that would indicate a more emergent situation.  Final Clinical Impression(s) / ED Diagnoses Final diagnoses:  None    Rx / DC Orders ED Discharge Orders     None         Geoffery Lyons, MD 04/12/23 [phone]

## 2023-04-22 ENCOUNTER — Encounter (HOSPITAL_BASED_OUTPATIENT_CLINIC_OR_DEPARTMENT_OTHER): Payer: Self-pay | Admitting: Emergency Medicine

## 2023-04-22 ENCOUNTER — Emergency Department (HOSPITAL_BASED_OUTPATIENT_CLINIC_OR_DEPARTMENT_OTHER): Payer: 59

## 2023-04-22 ENCOUNTER — Other Ambulatory Visit (HOSPITAL_BASED_OUTPATIENT_CLINIC_OR_DEPARTMENT_OTHER): Payer: Self-pay

## 2023-04-22 ENCOUNTER — Emergency Department (HOSPITAL_BASED_OUTPATIENT_CLINIC_OR_DEPARTMENT_OTHER)
Admission: EM | Admit: 2023-04-22 | Discharge: 2023-04-22 | Disposition: A | Discharge status: Home / Self Care | Payer: 59 | Attending: Emergency Medicine | Admitting: Emergency Medicine

## 2023-04-22 ENCOUNTER — Other Ambulatory Visit: Payer: Self-pay

## 2023-04-22 DIAGNOSIS — M7021 Olecranon bursitis, right elbow: Secondary | ICD-10-CM | POA: Insufficient documentation

## 2023-04-22 DIAGNOSIS — Z79899 Other long term (current) drug therapy: Secondary | ICD-10-CM | POA: Diagnosis not present

## 2023-04-22 DIAGNOSIS — Y9389 Activity, other specified: Secondary | ICD-10-CM | POA: Diagnosis not present

## 2023-04-22 DIAGNOSIS — M25521 Pain in right elbow: Secondary | ICD-10-CM | POA: Diagnosis present

## 2023-04-22 MED ORDER — COLCHICINE 0.6 MG PO TABS
0.6000 mg | ORAL_TABLET | Freq: Once | ORAL | Status: AC
  Administered 2023-04-22: 0.6 mg via ORAL
  Filled 2023-04-22: qty 1

## 2023-04-22 MED ORDER — NAPROXEN 500 MG PO TABS
500.0000 mg | ORAL_TABLET | Freq: Two times a day (BID) | ORAL | 0 refills | Status: DC | PRN
  Filled 2023-04-22: qty 30, 15d supply, fill #0

## 2023-04-22 MED ORDER — HYDROCODONE-ACETAMINOPHEN 5-325 MG PO TABS
1.0000 | ORAL_TABLET | Freq: Once | ORAL | Status: AC
  Administered 2023-04-22: 1 via ORAL
  Filled 2023-04-22: qty 1

## 2023-04-22 NOTE — ED Provider Notes (Signed)
[place] [facility] Provider Note   CSN: [id number] Arrival date & time: 04/22/23  0212     History  Chief Complaint  Patient presents with   Elbow Pain    Matthew Buchanan is a 37 y.o. male.  Patient with a medical history of hypertension presents with right elbow pain for the past 2 days.  Denies any trauma.  Pain is to his posterior elbow worse with movement.  Did not try anything for it at home.  No fever, chills, nausea or vomiting.  No warmth or erythema.  No bleeding or drainage.  No other joint pain.  Has required fluid drained from his right knee in the past but does not know the diagnosis.  Denies history of gout. He is right-handed.  Right elbow hurts with movement.  No associated weakness, numbness or tingling.  No warmth erythema.  No bleeding or drainage.  The history is provided by the patient.       Home Medications Prior to Admission medications   Medication Sig Start Date End Date Taking? Authorizing Provider  albuterol (VENTOLIN HFA) 108 (90 Base) MCG/ACT inhaler Inhale 2 puffs into the lungs every 6 (six) hours as needed for wheezing or shortness of breath.    [provider]  cephALEXin (KEFLEX) 500 MG capsule Take 1 capsule (500 mg total) by mouth 4 (four) times daily. 03/22/16   Rise Mu, PA-C  hydrochlorothiazide (HYDRODIURIL) 25 MG tablet Take 1 tablet (25 mg total) by mouth daily. 10/27/18   Linwood Dibbles, MD  HYDROcodone-acetaminophen (NORCO/VICODIN) 5-325 MG tablet Take 1 tablet by mouth every 6 (six) hours as needed for severe pain. 11/19/22   Renne Crigler, PA-C      Allergies    Patient has no known allergies.    Review of Systems   Review of Systems  Constitutional:  Negative for activity change, appetite change and fever.  HENT:  Negative for congestion and rhinorrhea.   Respiratory:  Negative for cough, chest tightness and shortness of breath.   Gastrointestinal:  Negative for abdominal pain,  nausea and vomiting.  Genitourinary:  Negative for dysuria.  Musculoskeletal:  Positive for arthralgias and myalgias.  Skin:  Negative for rash.  Neurological:  Negative for dizziness, weakness and headaches.   all other systems are negative except as noted in the HPI and PMH.    Physical Exam Updated Vital Signs BP (!) 145/84 (BP Location: Left Arm)   Pulse 84   Temp 98.8 F (37.1 C) (Oral)   Resp 18   Wt 119.7 kg   SpO2 95%   BMI 35.80 kg/m  Physical Exam Vitals and nursing note reviewed.  Constitutional:      General: He is not in acute distress.    Appearance: He is well-developed.  HENT:     Head: Normocephalic and atraumatic.     Mouth/Throat:     Pharynx: No oropharyngeal exudate.  Eyes:     Conjunctiva/sclera: Conjunctivae normal.     Pupils: Pupils are equal, round, and reactive to light.  Neck:     Comments: No meningismus. Cardiovascular:     Rate and Rhythm: Normal rate and regular rhythm.     Heart sounds: Normal heart sounds. No murmur heard. Pulmonary:     Effort: Pulmonary effort is normal. No respiratory distress.     Breath sounds: Normal breath sounds.  Abdominal:     Palpations: Abdomen is soft.     Tenderness: There is no  abdominal tenderness. There is no guarding or rebound.  Musculoskeletal:        General: Swelling and tenderness present.     Cervical back: Normal range of motion and neck supple.     Comments: Swollen right olecranon bursa, no warmth or erythema.  Pain with flexion and extension.  Pronation and supination intact.  Intact radial pulse and cardinal hand movements.  Skin:    General: Skin is warm.  Neurological:     Mental Status: He is alert and oriented to person, place, and time.     Cranial Nerves: No cranial nerve deficit.     Motor: No abnormal muscle tone.     Coordination: Coordination normal.     Comments:  5/5 strength throughout. CN 2-12 intact.Equal grip strength.   Psychiatric:        Behavior: Behavior normal.      ED Results / Procedures / Treatments   Labs (all labs ordered are listed, but only abnormal results are displayed) Labs Reviewed - No data to display  EKG None  Radiology DG Elbow 2 Views Right  Result Date: 04/22/2023 CLINICAL DATA:  Right elbow pain, swelling EXAM: RIGHT ELBOW - 2 VIEW COMPARISON:  12/28/2021 FINDINGS: Soft tissue swelling noted posteriorly. No acute bony abnormality. Specifically, no fracture, subluxation, or dislocation. No joint effusion. Joint spaces maintained. IMPRESSION: Posterior soft tissue swelling.  No acute bony abnormality. Electronically Signed   By: Charlett Nose M.D.   On: 04/22/2023 03:03    Procedures Procedures    Medications Ordered in ED Medications  HYDROcodone-acetaminophen (NORCO/VICODIN) 5-325 MG per tablet 1 tablet (1 tablet Oral Given 04/22/23 0302)  colchicine tablet 0.6 mg (0.6 mg Oral Given 04/22/23 0302)    ED Course/ Medical Decision Making/ A&P                                 Medical Decision Making Amount and/or Complexity of Data Reviewed Labs: ordered. Decision-making details documented in ED Course. Radiology: ordered and independent interpretation performed. Decision-making details documented in ED Course. ECG/medicine tests: ordered and independent interpretation performed. Decision-making details documented in ED Course.  Risk Prescription drug management.   Atraumatic right elbow pain.  Neurovascular intact.  No warmth or erythema.  Low suspicion for septic joint.  Suspect likely olecranon bursitis  Patient be treated with anti-inflammatories.  No evidence of septic joint.  Discussed rest, range of motion exercises, orthopedic follow-up.  Return precautions discussed       Final Clinical Impression(s) / ED Diagnoses Final diagnoses:  None    Rx / DC Orders ED Discharge Orders     None         Johnna Bollier, Jeannett Senior, MD 04/22/23 0320

## 2023-04-22 NOTE — Discharge Instructions (Addendum)
Take the anti-inflammatories as prescribed.  Do not rest on your elbow.  Follow-up with the orthopedic doctor.  Return to the ED with new or worsening symptoms.

## 2023-04-22 NOTE — ED Triage Notes (Signed)
Patient c/o right elbow pain x 2 days.  Patient denies injury/trauma.

## 2023-05-02 ENCOUNTER — Other Ambulatory Visit (HOSPITAL_BASED_OUTPATIENT_CLINIC_OR_DEPARTMENT_OTHER): Payer: Self-pay

## 2023-06-07 ENCOUNTER — Emergency Department (HOSPITAL_BASED_OUTPATIENT_CLINIC_OR_DEPARTMENT_OTHER)
Admission: EM | Admit: 2023-06-07 | Discharge: 2023-06-07 | Disposition: A | Discharge status: Home / Self Care | Payer: 59 | Attending: Emergency Medicine | Admitting: Emergency Medicine

## 2023-06-07 ENCOUNTER — Emergency Department (HOSPITAL_BASED_OUTPATIENT_CLINIC_OR_DEPARTMENT_OTHER): Payer: 59

## 2023-06-07 ENCOUNTER — Other Ambulatory Visit: Payer: Self-pay

## 2023-06-07 ENCOUNTER — Encounter (HOSPITAL_BASED_OUTPATIENT_CLINIC_OR_DEPARTMENT_OTHER): Payer: Self-pay

## 2023-06-07 DIAGNOSIS — M79672 Pain in left foot: Secondary | ICD-10-CM | POA: Insufficient documentation

## 2023-06-07 MED ORDER — NAPROXEN 250 MG PO TABS
500.0000 mg | ORAL_TABLET | Freq: Once | ORAL | Status: AC
  Administered 2023-06-07: 500 mg via ORAL
  Filled 2023-06-07: qty 2

## 2023-06-07 NOTE — Discharge Instructions (Addendum)
 As discussed, your imaging does not show signs of broken bones or dislocation.  Alternate between Ibuprofen  and Tylenol  every 4 hours as needed for pain.  Ice the foot as well for swelling.   Advised information for orthopedics.  You can follow-up with them if your pain persists.  Get help right away if: Your foot or toes turn white or blue. You have warmth and redness along your foot.

## 2023-06-07 NOTE — ED Triage Notes (Signed)
 The patient has left foot pain for 2 days. No injury.

## 2023-06-07 NOTE — ED Provider Notes (Addendum)
 [place] [facility] Provider Note   CSN: [id number] Arrival date & time: 06/07/23  1350     History  Chief Complaint  Patient presents with   Foot Pain    Matthew Buchanan is a 38 y.o. male with a history of hypertension who presents the ED today for foot pain.  Patient reports 2-day history of pain to the left foot.  He has pain on top and bottom of his foot with ambulation.  He denies any recent injuries or trauma.  No recent inversions or inversions of the ankle.  He has not been taking anything for pain.  He states that he has been trying not to walk on it second to pain. No weakness, loss of sensation, or impaired range of motion of the left foot.  No additional complaints or concerns at this time.    Home Medications Prior to Admission medications   Medication Sig Start Date End Date Taking? Authorizing Provider  albuterol  (VENTOLIN  HFA) 108 (90 Base) MCG/ACT inhaler Inhale 2 puffs into the lungs every 6 (six) hours as needed for wheezing or shortness of breath.    [provider]  cephALEXin  (KEFLEX ) 500 MG capsule Take 1 capsule (500 mg total) by mouth 4 (four) times daily. 03/22/16   Annabell Vinie DASEN, PA-C  hydrochlorothiazide  (HYDRODIURIL ) 25 MG tablet Take 1 tablet (25 mg total) by mouth daily. 10/27/18   Randol Simmonds, MD  HYDROcodone -acetaminophen  (NORCO/VICODIN) 5-325 MG tablet Take 1 tablet by mouth every 6 (six) hours as needed for severe pain. 11/19/22   Geiple, Joshua, PA-C  naproxen  (NAPROSYN ) 500 MG tablet Take 1 tablet (500 mg total) by mouth 2 (two) times daily as needed. 04/22/23   Carita Senior, MD      Allergies    Patient has no known allergies.    Review of Systems   Review of Systems  Musculoskeletal:        Foot pain  All other systems reviewed and are negative.   Physical Exam Updated Vital Signs BP (!) 141/99 (BP Location: Left Arm)   Pulse 81   Temp 98.5 F (36.9 C) (Oral)   Resp 16   Ht 6' (1.829 m)    Wt 119.3 kg   SpO2 96%   BMI 35.67 kg/m  Physical Exam Vitals and nursing note reviewed.  Constitutional:      General: He is not in acute distress.    Appearance: Normal appearance.  HENT:     Head: Normocephalic and atraumatic.     Mouth/Throat:     Mouth: Mucous membranes are moist.  Eyes:     Conjunctiva/sclera: Conjunctivae normal.     Pupils: Pupils are equal, round, and reactive to light.  Cardiovascular:     Rate and Rhythm: Normal rate.     Pulses: Normal pulses.  Pulmonary:     Effort: Pulmonary effort is normal.     Breath sounds: Normal breath sounds.  Musculoskeletal:        General: Tenderness present.     Comments: Tenderness to palpation of the lateral left foot as well as the bottom of the foot. Slight swelling noted at the lateral foot. Palpable dorsalis pedis pulse. No tenderness to the ankle.  Skin:    General: Skin is warm and dry.     Findings: No rash.     Comments: No erythema or warmth to touch to palpation of the foot  Neurological:     General: No focal deficit  present.     Mental Status: He is alert.     Sensory: No sensory deficit.     Motor: No weakness.  Psychiatric:        Mood and Affect: Mood normal.        Behavior: Behavior normal.    ED Results / Procedures / Treatments   Labs (all labs ordered are listed, but only abnormal results are displayed) Labs Reviewed - No data to display  EKG None  Radiology DG Foot Complete Left Result Date: 06/07/2023 CLINICAL DATA:  Left foot pain for 2 days.  No known injury. EXAM: LEFT FOOT - COMPLETE 3+ VIEW COMPARISON:  None Available. FINDINGS: The mineralization and alignment are normal. There is no evidence of acute fracture or dislocation. The joint spaces appear preserved. The soft tissues appear unremarkable. IMPRESSION: No evidence of acute fracture or dislocation. Electronically Signed   By: Elsie Perone M.D.   On: 06/07/2023 14:24    Procedures Procedures: not  indicated.   Medications Ordered in ED Medications  naproxen  (NAPROSYN ) tablet 500 mg (500 mg Oral Given 06/07/23 1623)    ED Course/ Medical Decision Making/ A&P                                 Medical Decision Making Amount and/or Complexity of Data Reviewed Radiology: ordered.  Risk Prescription drug management.   This patient presents to the ED for concern of foot pain, this involves an extensive number of treatment options, and is a complaint that carries with it a high risk of complications and morbidity.   Differential diagnosis includes: fracture, dislocation, ligamentous injury, septic arthritis, gout, etc. Low suspicion for septic arthritis, no fevers, warmth to touch, edema, exquisite tenderness/pain with movement of foot   Comorbidities  See HPI above   Additional History  Additional history obtained from prior records.   Imaging Studies  I ordered imaging studies including left foot x-ray.  I independently visualized and interpreted imaging which showed: No evidence of acute fracture or dislocation. I agree with the radiologist interpretation   Problem List / ED Course / Critical Interventions / Medication Management  Left foot pain x2 days at the bottom and left lateral foot with minimal swelling. He has not been taking anything for pain. He has tried not to put much weight on the foot as that exacerbates the pain. I ordered medications including: Naproxen  for pain Ace wrap for swelling  Medications given prior to discharge. I offered crutches to the patient but he declined, stating that he has some at home he can use.  Information for orthopedics given for follow up if pain persists.   Social Determinants of Health  Physical activity   Test / Admission - Considered  Discussed findings with patient.  All questions answered. He is hemodynamically stable and safe for discharge home. Return precautions given.       Final Clinical  Impression(s) / ED Diagnoses Final diagnoses:  Left foot pain    Rx / DC Orders ED Discharge Orders     None         Waddell Sluder, PA-C 06/07/23 1707    Waddell Sluder, PA-C 06/07/23 1709    Ruthe Cornet, DO 06/07/23 2243

## 2023-06-11 ENCOUNTER — Encounter: Payer: Self-pay | Admitting: Family Medicine

## 2023-06-11 ENCOUNTER — Ambulatory Visit (HOSPITAL_BASED_OUTPATIENT_CLINIC_OR_DEPARTMENT_OTHER)
Admission: RE | Admit: 2023-06-11 | Discharge: 2023-06-11 | Disposition: A | Discharge status: Home / Self Care | Payer: 59 | Source: Ambulatory Visit | Attending: Family Medicine | Admitting: Family Medicine

## 2023-06-11 ENCOUNTER — Ambulatory Visit (INDEPENDENT_AMBULATORY_CARE_PROVIDER_SITE_OTHER): Payer: 59 | Admitting: Family Medicine

## 2023-06-11 VITALS — BP 178/140 | Ht 72.0 in | Wt 263.0 lb

## 2023-06-11 DIAGNOSIS — M79671 Pain in right foot: Secondary | ICD-10-CM | POA: Diagnosis not present

## 2023-06-11 DIAGNOSIS — I10 Essential (primary) hypertension: Secondary | ICD-10-CM | POA: Diagnosis not present

## 2023-06-11 MED ORDER — NAPROXEN 500 MG PO TABS: 500.0000 mg | ORAL_TABLET | Freq: Two times a day (BID) | ORAL | 0 refills | Status: DC | PRN

## 2023-06-11 NOTE — Patient Instructions (Addendum)
 Ace wrap to both of your feet while you're home Rest, Ice, Compression, Elevation to your feet You can tylenol  with naproxen   If you stop Naproxen , you can pick up Voltaren gel from the pharmacy, and apply it to your foot every 6 hours We are getting an X-Ray today Please get a PCP Please do some plantar fasciitis exercises

## 2023-06-11 NOTE — Progress Notes (Signed)
 CHIEF COMPLAINT: No chief complaint on file.  _____________________________________________________________ SUBJECTIVE  HPI  Pt is a 38 y.o. male here for evaluation of bilateral foot pain Ongoing for  L x about a week  R x few days Inciting event: he has no idea how this happened, states he woke up and had pain Primarily located  Sides and the top of his foot  Whole bottom of the foot Side that hurts more: R Described as an aching Numbness/tingling: none Exacerbated by: pain just at rest Therapies tried: naproxen  in the ED helped a little bit  Next day the R foot started hurting  Does not work presently [employer] not wearing good shoes Denies hx gout  Seen 06/07/2023 in the ED for L foot pain, note reviewed: X-ray was ordered for L foot, negative for fracture Prescribed naproxen  for pain, Ace wrap for swelling Offered crutches but were declined as patient had stated he had some at home  Left foot x-ray independently reviewed: No notable degenerative changes, no acute fracture noticed, query trace enthesopathy over distal dorsal talus, and very mild enthesopathy of the plantar calcaneus  Medical history includes chronic CHF, diagnosed 12/08/2020 (initial EF 15-20% when admitted for HFE, improved to 20-25%), last note with [facility] 06/2022, does not appear to be on medications listed by cardiology. When asked if he has seen cardiology before, he endorsed 'no.' Does not have  PCP, cannot recall the last time he was seen for annual physical ------------------------------------------------------------------------------------------------------ Past Medical History:  Diagnosis Date   Hypertension     No past surgical history on file.    Outpatient Encounter Medications as of 06/11/2023  Medication Sig   albuterol  (VENTOLIN  HFA) 108 (90 Base) MCG/ACT inhaler Inhale 2 puffs into the lungs every 6 (six) hours as needed for wheezing or shortness of breath.   cephALEXin  (KEFLEX ) 500  MG capsule Take 1 capsule (500 mg total) by mouth 4 (four) times daily.   hydrochlorothiazide  (HYDRODIURIL ) 25 MG tablet Take 1 tablet (25 mg total) by mouth daily.   HYDROcodone -acetaminophen  (NORCO/VICODIN) 5-325 MG tablet Take 1 tablet by mouth every 6 (six) hours as needed for severe pain.   naproxen  (NAPROSYN ) 500 MG tablet Take 1 tablet (500 mg total) by mouth 2 (two) times daily as needed.   No facility-administered encounter medications on file as of 06/11/2023.    ------------------------------------------------------------------------------------------------------  _____________________________________________________________ OBJECTIVE  PHYSICAL EXAM  Today's Vitals   06/11/23 0928 06/11/23 1001  BP: (!) 170/118 (!) 178/140  Weight: 263 lb (119.3 kg)   Height: 6' (1.829 m)    Body mass index is 35.67 kg/m.   reviewed  General: A+Ox3, no acute distress, well-nourished, appropriate affect CV: pulses 2+ regular, nondiaphoretic, no peripheral edema, cap refill <2sec Lungs: no audible wheezing, non-labored breathing, bilateral chest rise/fall, nontachypneic Skin: warm, well-perfused, non-icteric, no susp lesions or rashes Neuro: Sensation intact, muscle tone wnl, no atrophy Psych: no signs of depression or anxiety MSK: b/l feet Clean, dry, intact skin. no ecchymosis, no erythema, no increased warmth. There is soft tissue prominence of dorsal foot soft tissue, non-pitting edematous appearance. Non-pitting edema of distal lower extremities. Diffuse tenderness to light palpation over dorsal foot and lateral foot. Full active and passive ROM ankle and toes. Light touch sensation intact. Tenderness over proximal plantar fascia.  Gait antalgic  NEURO: sensation intact to light touch, no focal defecits VASCULAR: pulses +2/4 dorsalis pedis & posterior tibialis  _____________________________________________________________ ASSESSMENT/PLAN Diagnoses and all orders for this  visit:  Acute foot  pain, right -     DG Foot Complete Right; Future -     Uric acid; Future -     Ambulatory referral to Family Practice  Uncontrolled hypertension -     Ambulatory referral to [facility]  Other orders -     naproxen  (NAPROSYN ) 500 MG tablet; Take 1 tablet (500 mg total) by mouth 2 (two) times daily as needed.   Possible compensatory R foot pain from initial L foot pain. B/l foot pain does not follow bony or joint distribution, likely multifactorial process including plantar fasciitis. Unlikely gout flare, however will obtain uric acid level. Will proceed with initial conservative treatment of foot pain and XR, HEP exercises for plantar fasciitis were provided, naproxen  for pain management. Plan to return in 1 week. Elevated index of suspicion for systemic etiology in setting of several potentially uncontrolled processes and diffuse nature of patient's pain. Uncontrolled HTN today, pt asymptomatic today. He shares that he did not take medication this morning, agrees to take medication when he returns home. Reviewed emergency/red flag symptoms that will warrant ED presentation; patient verbalized understanding. Recommended to establish care with PCP, which he is amenable to. Referral sent today. All questions answered. Return precautions discussed. Patient verbalized understanding and is in agreement with plan Electronically signed by: Shley Dolby W Juanetta Negash, MD 06/11/2023 7:52 AM

## 2023-06-25 ENCOUNTER — Ambulatory Visit: Payer: 59 | Admitting: Family Medicine

## 2023-06-25 NOTE — Progress Notes (Deleted)
CHIEF COMPLAINT: No chief complaint on file.  _____________________________________________________________ SUBJECTIVE  HPI  Pt is a 38 y.o. male here for Follow-up of bilateral foot pain   left foot pain ongoing since beginning of January, right foot pain followed a few days later, suspect compensatory.  No known inciting event for left foot pain.  Bilateral foot pain has been diffuse, nonradiating, no associated/tingling.  Last seen 06/11/2023, provided HEP exercises, naproxen, referral to PCP in light of his complex medical history  Numbness/tingling Catching/locking *** Exacerbated by Therapies tried so far:  Works as Therapist, music  Left foot x-ray independently reviewed: No notable degenerative changes, no acute fracture noticed, query trace enthesopathy over distal dorsal talus, and very mild enthesopathy of the plantar calcaneus 06/11/23 right foot x-ray independently reviewed, preserved joint spacing, no significant degenerative changes, no acute fractures. Medical history includes chronic CHF, diagnosed 12/08/2020 (initial EF 15-20% when admitted for HFE, improved to 20-25%), last note with [facility] 06/2022, does not appear to be on medications listed by cardiology. When asked if he has seen cardiology before, he endorsed 'no.' Does not have  PCP, cannot recall the last time he was seen for annual physical  ------------------------------------------------------------------------------------------------------ Past Medical History:  Diagnosis Date   Hypertension     No past surgical history on file.    Outpatient Encounter Medications as of 06/25/2023  Medication Sig   albuterol (VENTOLIN HFA) 108 (90 Base) MCG/ACT inhaler Inhale 2 puffs into the lungs every 6 (six) hours as needed for wheezing or shortness of breath.   cephALEXin (KEFLEX) 500 MG capsule Take 1 capsule (500 mg total) by mouth 4 (four) times daily.   hydrochlorothiazide (HYDRODIURIL) 25 MG tablet Take 1 tablet  (25 mg total) by mouth daily.   HYDROcodone-acetaminophen (NORCO/VICODIN) 5-325 MG tablet Take 1 tablet by mouth every 6 (six) hours as needed for severe pain.   naproxen (NAPROSYN) 500 MG tablet Take 1 tablet (500 mg total) by mouth 2 (two) times daily as needed.   No facility-administered encounter medications on file as of 06/25/2023.    ------------------------------------------------------------------------------------------------------  _____________________________________________________________ OBJECTIVE  PHYSICAL EXAM  There were no vitals filed for this visit. There is no height or weight on file to calculate BMI.   reviewed  General: A+Ox3, no acute distress, well-nourished, appropriate affect CV: pulses 2+ regular, nondiaphoretic, no peripheral edema, cap refill <2sec Lungs: no audible wheezing, non-labored breathing, bilateral chest rise/fall, nontachypneic Skin: warm, well-perfused, non-icteric, no susp lesions or rashes Neuro: *** Sensation intact, muscle tone wnl, no atrophy Psych: no signs of depression or anxiety MSK: ***      _____________________________________________________________ ASSESSMENT/PLAN There are no diagnoses linked to this encounter.      Electronically signed by: Burna Forts, MD 06/25/2023 7:45 AM

## 2023-06-26 ENCOUNTER — Ambulatory Visit: Payer: 59 | Admitting: Family Medicine

## 2023-07-04 ENCOUNTER — Emergency Department (HOSPITAL_BASED_OUTPATIENT_CLINIC_OR_DEPARTMENT_OTHER)
Admission: EM | Admit: 2023-07-04 | Discharge: 2023-07-04 | Disposition: A | Discharge status: Home / Self Care | Payer: 59 | Attending: Emergency Medicine | Admitting: Emergency Medicine

## 2023-07-04 ENCOUNTER — Encounter (HOSPITAL_BASED_OUTPATIENT_CLINIC_OR_DEPARTMENT_OTHER): Payer: Self-pay | Admitting: Urology

## 2023-07-04 ENCOUNTER — Other Ambulatory Visit: Payer: Self-pay

## 2023-07-04 DIAGNOSIS — K0889 Other specified disorders of teeth and supporting structures: Secondary | ICD-10-CM | POA: Diagnosis present

## 2023-07-04 MED ORDER — AMOXICILLIN 500 MG PO CAPS
1000.0000 mg | ORAL_CAPSULE | Freq: Once | ORAL | Status: AC
  Administered 2023-07-04: 1000 mg via ORAL
  Filled 2023-07-04: qty 2

## 2023-07-04 MED ORDER — AMOXICILLIN 500 MG PO CAPS: 1000.0000 mg | ORAL_CAPSULE | Freq: Two times a day (BID) | ORAL | 0 refills | Status: DC

## 2023-07-04 MED ORDER — HYDROCODONE-ACETAMINOPHEN 5-325 MG PO TABS
1.0000 | ORAL_TABLET | Freq: Once | ORAL | Status: AC
  Administered 2023-07-04: 1 via ORAL
  Filled 2023-07-04: qty 1

## 2023-07-04 MED ORDER — IBUPROFEN 600 MG PO TABS: 600.0000 mg | ORAL_TABLET | Freq: Four times a day (QID) | ORAL | 0 refills | Status: DC | PRN

## 2023-07-04 MED ORDER — HYDROCODONE-ACETAMINOPHEN 5-325 MG PO TABS
1.0000 | ORAL_TABLET | ORAL | 0 refills | Status: DC | PRN
Start: 2023-07-04 — End: 2023-07-18

## 2023-07-04 NOTE — ED Provider Notes (Signed)
[place] [facility] Provider Note   CSN: [id number] Arrival date & time: 07/04/23  2003     History  Chief Complaint  Patient presents with   Dental Pain    Matthew Buchanan is a 38 y.o. male.  Patient with lower left dental pain x 2 days. No injury. No facial swelling, fever or difficulty swallowing.   The history is provided by the patient. No language interpreter was used.  Dental Pain      Home Medications Prior to Admission medications   Medication Sig Start Date End Date Taking? Authorizing Provider  amoxicillin (AMOXIL) 500 MG capsule Take 2 capsules (1,000 mg total) by mouth 2 (two) times daily. 07/04/23  Yes Elpidio Anis, PA-C  HYDROcodone-acetaminophen (NORCO/VICODIN) 5-325 MG tablet Take 1 tablet by mouth every 4 (four) hours as needed. 07/04/23  Yes Amelita Risinger, Melvenia Beam, PA-C  ibuprofen (ADVIL) 600 MG tablet Take 1 tablet (600 mg total) by mouth every 6 (six) hours as needed. 07/04/23  Yes Silas Sedam, Melvenia Beam, PA-C  albuterol (VENTOLIN HFA) 108 (90 Base) MCG/ACT inhaler Inhale 2 puffs into the lungs every 6 (six) hours as needed for wheezing or shortness of breath.    [provider]  cephALEXin (KEFLEX) 500 MG capsule Take 1 capsule (500 mg total) by mouth 4 (four) times daily. 03/22/16   Rise Mu, PA-C  hydrochlorothiazide (HYDRODIURIL) 25 MG tablet Take 1 tablet (25 mg total) by mouth daily. 10/27/18   Linwood Dibbles, MD  naproxen (NAPROSYN) 500 MG tablet Take 1 tablet (500 mg total) by mouth 2 (two) times daily as needed. 06/11/23   Burna Forts, MD      Allergies    Patient has no known allergies.    Review of Systems   Review of Systems  Physical Exam Updated Vital Signs BP (!) 167/113 (BP Location: Right Arm)   Pulse 78   Temp 98.2 F (36.8 C)   Resp 18   Ht 6' (1.829 m)   Wt 119 kg   SpO2 95%   BMI 35.58 kg/m  Physical Exam Constitutional:      Appearance: He is well-developed.  HENT:     Mouth/Throat:      Comments: Generally good dentition without significant decay or dental caries. Tenderness to #19 without visualized abscess. There is bleeding at the base of the tooth with palpation.  Pulmonary:     Effort: Pulmonary effort is normal.  Musculoskeletal:        General: Normal range of motion.     Cervical back: Normal range of motion.  Skin:    General: Skin is warm and dry.  Neurological:     Mental Status: He is alert and oriented to person, place, and time.     ED Results / Procedures / Treatments   Labs (all labs ordered are listed, but only abnormal results are displayed) Labs Reviewed - No data to display  EKG None  Radiology No results found.  Procedures Procedures    Medications Ordered in ED Medications  amoxicillin (AMOXIL) capsule 1,000 mg (has no administration in time range)  HYDROcodone-acetaminophen (NORCO/VICODIN) 5-325 MG per tablet 1 tablet (has no administration in time range)    ED Course/ Medical Decision Making/ A&P Clinical Course as of 07/04/23 2238  Fri Jul 04, 2023  2238 Dental pain x 2 days. No visualized abscess. Dental tenderness to #19. Will cover with abx, refer to dentistry.  [SU]    Clinical Course User Index [  SU] Elpidio Anis, PA-C                                 Medical Decision Making Risk Prescription drug management.           Final Clinical Impression(s) / ED Diagnoses Final diagnoses:  Pain, dental    Rx / DC Orders ED Discharge Orders          Ordered    amoxicillin (AMOXIL) 500 MG capsule  2 times daily        07/04/23 2235    HYDROcodone-acetaminophen (NORCO/VICODIN) 5-325 MG tablet  Every 4 hours PRN        07/04/23 2235    ibuprofen (ADVIL) 600 MG tablet  Every 6 hours PRN        07/04/23 2235              Elpidio Anis, PA-C 07/04/23 2238    Jacalyn Lefevre, MD 07/04/23 2348

## 2023-07-04 NOTE — Discharge Instructions (Signed)
Take the medications as prescribed. Follow up with a dentist of your choice for further evaluation next week.

## 2023-07-04 NOTE — ED Triage Notes (Signed)
Pt states left lower dental pain x 2 days  Took tylenol 3 hrs pTA  Pt does not have dentist

## 2023-07-18 ENCOUNTER — Encounter: Payer: Self-pay | Admitting: Physician Assistant

## 2023-07-18 ENCOUNTER — Ambulatory Visit (INDEPENDENT_AMBULATORY_CARE_PROVIDER_SITE_OTHER): Payer: 59 | Admitting: Physician Assistant

## 2023-07-18 VITALS — BP 166/116 | HR 80 | Temp 98.2°F | Resp 18 | Ht 72.0 in | Wt 293.0 lb

## 2023-07-18 DIAGNOSIS — I5022 Chronic systolic (congestive) heart failure: Secondary | ICD-10-CM | POA: Insufficient documentation

## 2023-07-18 DIAGNOSIS — R7303 Prediabetes: Secondary | ICD-10-CM | POA: Insufficient documentation

## 2023-07-18 DIAGNOSIS — I10 Essential (primary) hypertension: Secondary | ICD-10-CM | POA: Diagnosis not present

## 2023-07-18 DIAGNOSIS — Z1159 Encounter for screening for other viral diseases: Secondary | ICD-10-CM

## 2023-07-18 DIAGNOSIS — Z720 Tobacco use: Secondary | ICD-10-CM | POA: Insufficient documentation

## 2023-07-18 DIAGNOSIS — Z114 Encounter for screening for human immunodeficiency virus [HIV]: Secondary | ICD-10-CM

## 2023-07-18 LAB — CBC WITH DIFFERENTIAL/PLATELET
Basophils Absolute: 0.1 10*3/uL (ref 0.0–0.1)
Basophils Relative: 0.9 % (ref 0.0–3.0)
Eosinophils Absolute: 0.3 10*3/uL (ref 0.0–0.7)
Eosinophils Relative: 3.2 % (ref 0.0–5.0)
HCT: 45 % (ref 39.0–52.0)
Hemoglobin: 14.9 g/dL (ref 13.0–17.0)
Lymphocytes Relative: 34.4 % (ref 12.0–46.0)
Lymphs Abs: 2.8 10*3/uL (ref 0.7–4.0)
MCHC: 33.1 g/dL (ref 30.0–36.0)
MCV: 95.7 fL (ref 78.0–100.0)
Monocytes Absolute: 1 10*3/uL (ref 0.1–1.0)
Monocytes Relative: 12.6 % — ABNORMAL HIGH (ref 3.0–12.0)
Neutro Abs: 4 10*3/uL (ref 1.4–7.7)
Neutrophils Relative %: 48.9 % (ref 43.0–77.0)
Platelets: 212 10*3/uL (ref 150.0–400.0)
RBC: 4.7 Mil/uL (ref 4.22–5.81)
RDW: 14.2 % (ref 11.5–15.5)
WBC: 8.1 10*3/uL (ref 4.0–10.5)

## 2023-07-18 LAB — COMPREHENSIVE METABOLIC PANEL
ALT: 28 U/L (ref 0–53)
AST: 17 U/L (ref 0–37)
Albumin: 4.4 g/dL (ref 3.5–5.2)
Alkaline Phosphatase: 55 U/L (ref 39–117)
BUN: 13 mg/dL (ref 6–23)
CO2: 28 meq/L (ref 19–32)
Calcium: 9.4 mg/dL (ref 8.4–10.5)
Chloride: 106 meq/L (ref 96–112)
Creatinine, Ser: 1.19 mg/dL (ref 0.40–1.50)
GFR: 78.11 mL/min (ref >60.00)
Glucose, Bld: 81 mg/dL (ref 70–99)
Potassium: 3.7 meq/L (ref 3.5–5.1)
Sodium: 140 meq/L (ref 135–145)
Total Bilirubin: 0.3 mg/dL (ref 0.2–1.2)
Total Protein: 7.7 g/dL (ref 6.0–8.3)

## 2023-07-18 LAB — LIPID PANEL
Cholesterol: 166 mg/dL (ref 0–200)
HDL: 36.5 mg/dL — ABNORMAL LOW (ref >39.00)
LDL Cholesterol: 85 mg/dL (ref 0–99)
NonHDL: 129.92
Total CHOL/HDL Ratio: 5
Triglycerides: 223 mg/dL — ABNORMAL HIGH (ref 0.0–149.0)
VLDL: 44.6 mg/dL — ABNORMAL HIGH (ref 0.0–40.0)

## 2023-07-18 LAB — HEMOGLOBIN A1C: Hgb A1c MFr Bld: 5.8 % (ref 4.6–6.5)

## 2023-07-18 LAB — BRAIN NATRIURETIC PEPTIDE: Pro B Natriuretic peptide (BNP): 46 pg/mL (ref 0.0–100.0)

## 2023-07-18 MED ORDER — FUROSEMIDE 40 MG PO TABS
40.0000 mg | ORAL_TABLET | Freq: Every day | ORAL | 0 refills | Status: DC
Start: 2023-07-18 — End: 2023-07-28

## 2023-07-18 MED ORDER — CARVEDILOL 12.5 MG PO TABS: 12.5000 mg | ORAL_TABLET | Freq: Two times a day (BID) | ORAL | 0 refills | Status: DC

## 2023-07-18 MED ORDER — LISINOPRIL 10 MG PO TABS
10.0000 mg | ORAL_TABLET | Freq: Every day | ORAL | 0 refills | Status: DC
Start: 2023-07-18 — End: 2023-07-28

## 2023-07-18 NOTE — Assessment & Plan Note (Addendum)
Elevated in office, but pt off of his meds.  Re-start lisinopril 10 mg, coreg 12.5 mg BID F/u 3-4 weeks. Advised pt to check his BP regularly.

## 2023-07-18 NOTE — Assessment & Plan Note (Signed)
Initially dx 7/22 on TEE, last TEE 11/23 demonstrated LVEF of 20-25%.  Pt appears euvolemic today despite being off of his medication for 1 week.   On review of cardiology notes, last visit 06/2022-- he was managed on lasix 40 mg daily, carvedilol 12.5 mg BID. I do not see a recent spironolactone prescription. Pt does not bring his medication bottles today.  Will refill lasix, coreg.

## 2023-07-18 NOTE — Progress Notes (Signed)
New patient visit   Patient: Matthew Buchanan   DOB: 08-Dec-1985   37 y.o. Male  MRN: [id number] Visit Date: 07/18/2023  Today's healthcare provider: Alfredia Ferguson, PA-C   Cc. New patient, med refills.  Subjective    Matthew Buchanan is a 38 y.o. male with a history of CHF, HTN who presents today as a new patient to establish care.  Discussed the use of AI scribe software for clinical note transcription with the patient, who gave verbal consent to proceed.  History of Present Illness   The patient, with a history of high blood pressure and heart failure, presents after being off his medications for about a week. The patient reports feeling generally well, with no specific complaints or symptoms. The patient experiences occasional leg swelling, which occurs infrequently, about every four to five months. The patient does not take any extra medication when he notices leg swelling. The patient has a history of gallbladder surgery and smokes cigars daily, a habit he started as a teenager.       Past Medical History:  Diagnosis Date   Hypertension    Past Surgical History:  Procedure Laterality Date   CHOLECYSTECTOMY  2022   Family Status  Relation Name Status   Mother  Alive   Father  Deceased   Brother  Alive   Brother  Alive   MGM  Deceased   MGF  Deceased   PGM  Deceased   PGF  Deceased   Daughter  Alive   Daughter  Alive   Daughter  Alive   Son  Alive   Son  Alive  No partnership data on file   Family History  Problem Relation Age of Onset   Hypertension Mother    Social History   Socioeconomic History   Marital status: Single    Spouse name: Not on file   Number of children: Not on file   Years of education: Not on file   Highest education level: Not on file  Occupational History   Not on file  Tobacco Use   Smoking status: Every Day    Types: Cigars   Smokeless tobacco: Never   Tobacco comments:    Pt has been smoking daily cigars since 2006.  Vaping Use    Vaping status: Never Used  Substance and Sexual Activity   Alcohol use: Yes    Comment: occ   Drug use: Yes    Types: Marijuana    Comment: occ   Sexual activity: Yes  Other Topics Concern   Not on file  Social History Narrative   Not on file   Social Drivers of Health   Financial Resource Strain: Medium Risk (12/19/2020)   Received from [facility] visits prior to 08/03/2022., [facility] visits prior to 08/03/2022.   Overall Financial Resource Strain (CARDIA)    Difficulty of Paying Living Expenses: Somewhat hard  Food Insecurity: No Food Insecurity (12/19/2020)   [facility] visits prior to 08/03/2022., [facility] visits prior to 08/03/2022.   Hunger Vital Sign    Worried About Running Out of Food in the Last Year: Never true    Ran Out of Food in the Last Year: Never true  Transportation Needs: No Transportation Needs (12/19/2020)   Received from [facility] visits prior to 08/03/2022., [facility] visits prior to 08/03/2022.   PRAPARE - Transportation  Lack of Transportation (Medical): No    Lack of Transportation (Non-Medical): No  Physical Activity: Not on file  Stress: Not on file  Social Connections: Not on file   Outpatient Medications Prior to Visit  Medication Sig   magnesium oxide (MAG-OX) 400 MG tablet Take by mouth.   [DISCONTINUED] carvedilol (COREG) 12.5 MG tablet Take by mouth.   [DISCONTINUED] furosemide (LASIX) 40 MG tablet Take by mouth.   [DISCONTINUED] lisinopril (ZESTRIL) 10 MG tablet Take by mouth.   [DISCONTINUED] spironolactone (ALDACTONE) 25 MG tablet Take by mouth.   [DISCONTINUED] albuterol (VENTOLIN HFA) 108 (90 Base) MCG/ACT inhaler Inhale 2 puffs into the lungs every 6 (six) hours as needed for wheezing or shortness of breath.   [DISCONTINUED] amoxicillin (AMOXIL) 500 MG capsule Take 2 capsules (1,000 mg total)  by mouth 2 (two) times daily.   [DISCONTINUED] cephALEXin (KEFLEX) 500 MG capsule Take 1 capsule (500 mg total) by mouth 4 (four) times daily.   [DISCONTINUED] hydrochlorothiazide (HYDRODIURIL) 25 MG tablet Take 1 tablet (25 mg total) by mouth daily.   [DISCONTINUED] HYDROcodone-acetaminophen (NORCO/VICODIN) 5-325 MG tablet Take 1 tablet by mouth every 4 (four) hours as needed.   [DISCONTINUED] ibuprofen (ADVIL) 600 MG tablet Take 1 tablet (600 mg total) by mouth every 6 (six) hours as needed.   [DISCONTINUED] naproxen (NAPROSYN) 500 MG tablet Take 1 tablet (500 mg total) by mouth 2 (two) times daily as needed.   No facility-administered medications prior to visit.   No Known Allergies   There is no immunization history on file for this patient.  Health Maintenance  Topic Date Due   Pneumococcal Vaccine 72-73 Years old (1 of 2 - PCV) Never done   HIV Screening  Never done   Hepatitis C Screening  Never done   DTaP/Tdap/Td (1 - Tdap) Never done   COVID-19 Vaccine (1 - 2024-25 season) Never done   INFLUENZA VACCINE  09/01/2023 (Originally 01/02/2023)   HPV VACCINES  Aged Out    Patient Care Team: Alfredia Ferguson, PA-C as PCP - General (Physician Assistant)  Review of Systems  Constitutional:  Negative for fatigue and fever.  HENT:  Positive for congestion and rhinorrhea.   Respiratory:  Negative for cough and shortness of breath.   Cardiovascular:  Negative for chest pain, palpitations and leg swelling.  Neurological:  Negative for dizziness and headaches.        Objective    BP (!) 166/116   Pulse 80   Temp 98.2 F (36.8 C) (Oral)   Resp 18   Ht 6' (1.829 m)   Wt 293 lb (132.9 kg)   SpO2 100%   BMI 39.74 kg/m     Physical Exam Constitutional:      General: He is awake.     Appearance: He is well-developed.  HENT:     Head: Normocephalic.     Nose: Congestion and rhinorrhea present.  Eyes:     Conjunctiva/sclera: Conjunctivae normal.  Cardiovascular:      Rate and Rhythm: Normal rate and regular rhythm.     Heart sounds: Normal heart sounds.  Pulmonary:     Effort: Pulmonary effort is normal.     Breath sounds: Normal breath sounds.  Musculoskeletal:     Right lower leg: No edema.     Left lower leg: No edema.  Skin:    General: Skin is warm.  Neurological:     Mental Status: He is alert and oriented to person, place, and time.  Psychiatric:  Attention and Perception: Attention normal.        Mood and Affect: Mood normal.        Speech: Speech normal.        Behavior: Behavior is cooperative.    Depression Screen    07/18/2023    9:12 AM  PHQ 2/9 Scores  PHQ - 2 Score 0  PHQ- 9 Score 0   Results for orders placed or performed in visit on 07/18/23  CBC w/Diff  Result Value Ref Range   WBC 8.1 4.0 - 10.5 K/uL   RBC 4.70 4.22 - 5.81 Mil/uL   Hemoglobin 14.9 13.0 - 17.0 g/dL   HCT 82.9 56.2 - 13.0 %   MCV 95.7 78.0 - 100.0 fl   MCHC 33.1 30.0 - 36.0 g/dL   RDW 86.5 78.4 - 69.6 %   Platelets 212.0 150.0 - 400.0 K/uL   Neutrophils Relative % 48.9 43.0 - 77.0 %   Lymphocytes Relative 34.4 12.0 - 46.0 %   Monocytes Relative 12.6 (H) 3.0 - 12.0 %   Eosinophils Relative 3.2 0.0 - 5.0 %   Basophils Relative 0.9 0.0 - 3.0 %   Neutro Abs 4.0 1.4 - 7.7 K/uL   Lymphs Abs 2.8 0.7 - 4.0 K/uL   Monocytes Absolute 1.0 0.1 - 1.0 K/uL   Eosinophils Absolute 0.3 0.0 - 0.7 K/uL   Basophils Absolute 0.1 0.0 - 0.1 K/uL  Comp Met (CMET)  Result Value Ref Range   Sodium 140 135 - 145 mEq/L   Potassium 3.7 3.5 - 5.1 mEq/L   Chloride 106 96 - 112 mEq/L   CO2 28 19 - 32 mEq/L   Glucose, Bld 81 70 - 99 mg/dL   BUN 13 6 - 23 mg/dL   Creatinine, Ser 2.95 0.40 - 1.50 mg/dL   Total Bilirubin 0.3 0.2 - 1.2 mg/dL   Alkaline Phosphatase 55 39 - 117 U/L   AST 17 0 - 37 U/L   ALT 28 0 - 53 U/L   Total Protein 7.7 6.0 - 8.3 g/dL   Albumin 4.4 3.5 - 5.2 g/dL   GFR 28.41 >32.44 mL/min   Calcium 9.4 8.4 - 10.5 mg/dL  HgB W1U  Result Value  Ref Range   Hgb A1c MFr Bld 5.8 4.6 - 6.5 %  Lipid panel  Result Value Ref Range   Cholesterol 166 0 - 200 mg/dL   Triglycerides 272.5 (H) 0.0 - 149.0 mg/dL   HDL 36.64 (L) >40.34 mg/dL   VLDL 74.2 (H) 0.0 - 59.5 mg/dL   LDL Cholesterol 85 0 - 99 mg/dL   Total CHOL/HDL Ratio 5    NonHDL 129.92   B Nat Peptide  Result Value Ref Range   Pro B Natriuretic peptide (BNP) 46.0 0.0 - 100.0 pg/mL    Assessment & Plan     Chronic systolic heart failure (HCC) Assessment & Plan: Initially dx 7/22 on TEE, last TEE 11/23 demonstrated LVEF of 20-25%.  Pt appears euvolemic today despite being off of his medication for 1 week.   On review of cardiology notes, last visit 06/2022-- he was managed on lasix 40 mg daily, carvedilol 12.5 mg BID. I do not see a recent spironolactone prescription. Pt does not bring his medication bottles today.  Will refill lasix, coreg.  Orders: -     Ambulatory referral to Cardiology -     Lipid panel -     Brain natriuretic peptide -     Furosemide; Take 1 tablet (  40 mg total) by mouth daily.  Dispense: 90 tablet; Refill: 0 -     Carvedilol; Take 1 tablet (12.5 mg total) by mouth 2 (two) times daily with a meal.  Dispense: 180 tablet; Refill: 0  Primary hypertension Assessment & Plan: Elevated in office, but pt off of his meds.  Re-start lisinopril 10 mg, coreg 12.5 mg BID F/u 3-4 weeks. Advised pt to check his BP regularly.    Orders: -     Ambulatory referral to Cardiology -     CBC with Differential/Platelet -     Comprehensive metabolic panel -     Lipid panel -     Lisinopril; Take 1 tablet (10 mg total) by mouth daily.  Dispense: 90 tablet; Refill: 0 -     Carvedilol; Take 1 tablet (12.5 mg total) by mouth 2 (two) times daily with a meal.  Dispense: 180 tablet; Refill: 0  Prediabetes Assessment & Plan: Historically will repeat A1c today  Orders: -     Hemoglobin A1c  Encounter for screening for HIV -     HIV Antibody (routine testing w  rflx)  Encounter for hepatitis C screening test for low risk patient -     Hepatitis C antibody  Tobacco use Assessment & Plan: Encouraged smoking cessation   Declines flu vaccine.   Return in about 4 weeks (around 08/15/2023) for hypertension.      Alfredia Ferguson, PA-C  [facility] at [facility] [phone] (phone) [phone] (fax)  [facility]

## 2023-07-18 NOTE — Assessment & Plan Note (Signed)
Historically will repeat A1c today

## 2023-07-18 NOTE — Assessment & Plan Note (Signed)
Encouraged smoking cessation

## 2023-07-19 LAB — HIV ANTIBODY (ROUTINE TESTING W REFLEX): HIV 1&2 Ab, 4th Generation: NONREACTIVE

## 2023-07-19 LAB — HEPATITIS C ANTIBODY: Hepatitis C Ab: NONREACTIVE

## 2023-07-28 ENCOUNTER — Other Ambulatory Visit: Payer: Self-pay | Admitting: *Deleted

## 2023-07-28 ENCOUNTER — Telehealth: Payer: Self-pay | Admitting: Physician Assistant

## 2023-07-28 DIAGNOSIS — I5022 Chronic systolic (congestive) heart failure: Secondary | ICD-10-CM

## 2023-07-28 DIAGNOSIS — I10 Essential (primary) hypertension: Secondary | ICD-10-CM

## 2023-07-28 MED ORDER — FUROSEMIDE 40 MG PO TABS: 40.0000 mg | ORAL_TABLET | Freq: Every day | ORAL | 0 refills | Status: DC

## 2023-07-28 MED ORDER — CARVEDILOL 12.5 MG PO TABS: 12.5000 mg | ORAL_TABLET | Freq: Two times a day (BID) | ORAL | 0 refills | Status: DC

## 2023-07-28 MED ORDER — LISINOPRIL 10 MG PO TABS
10.0000 mg | ORAL_TABLET | Freq: Every day | ORAL | 0 refills | Status: DC
Start: 2023-07-28 — End: 2023-10-28

## 2023-07-28 NOTE — Telephone Encounter (Signed)
 Copied from CRM [id number]. Topic: Clinical - Prescription Issue >> Jul 28, 2023 12:13 PM Denese Killings wrote: Reason for CRM: Patient states that the [place] [facility] [place] [facility] has no contract with insurance and patient wants to know if he can get lisinopril (ZESTRIL) 10 MG tablet, furosemide (LASIX) 40 MG tablet, spironolactone from somewhere else or back to [facility]. He states that he can't pay out of pocket.

## 2023-07-28 NOTE — Telephone Encounter (Signed)
 Spoke with pt and [facility]

## 2023-08-08 ENCOUNTER — Ambulatory Visit: Payer: 59 | Admitting: Physician Assistant

## 2023-08-08 NOTE — Progress Notes (Deleted)
      Established patient visit   Patient: Matthew Buchanan   DOB: Feb 26, 1986   37 y.o. Male  MRN: [id number] Visit Date: 08/08/2023  Today's healthcare provider: Alfredia Ferguson, PA-C   No chief complaint on file.  Subjective     Hypertension, follow-up  BP Readings from Last 3 Encounters:  07/18/23 (!) 166/116  07/04/23 (!) 167/113  06/11/23 (!) 178/140   Wt Readings from Last 3 Encounters:  07/18/23 293 lb (132.9 kg)  07/04/23 262 lb 5.6 oz (119 kg)  06/11/23 263 lb (119.3 kg)     He was last seen for hypertension around a month ago. We re-started lisinopril 10 mg and coreg 12.5 mg BID.   Outside blood pressures are {***enter patient reported home BP readings, or 'not being checked':1}. Symptoms: {Yes/No:20286} chest pain {Yes/No:20286} chest pressure  {Yes/No:20286} palpitations {Yes/No:20286} syncope  {Yes/No:20286} dyspnea {Yes/No:20286} orthopnea  {Yes/No:20286} paroxysmal nocturnal dyspnea {Yes/No:20286} lower extremity edema   Pertinent labs Lab Results  Component Value Date   CHOL 166 07/18/2023   HDL 36.50 (L) 07/18/2023   LDLCALC 85 07/18/2023   TRIG 223.0 (H) 07/18/2023   CHOLHDL 5 07/18/2023   Lab Results  Component Value Date   NA 140 07/18/2023   K 3.7 07/18/2023   CREATININE 1.19 07/18/2023   GFRNONAA >60 10/27/2018   GLUCOSE 81 07/18/2023     The ASCVD Risk score (Arnett DK, et al., 2019) failed to calculate for the following reasons:   The 2019 ASCVD risk score is only valid for ages 3 to 20  ---------------------------------------------------------------------------------------------------   Medications: Outpatient Medications Prior to Visit  Medication Sig   carvedilol (COREG) 12.5 MG tablet Take 1 tablet (12.5 mg total) by mouth 2 (two) times daily with a meal.   furosemide (LASIX) 40 MG tablet Take 1 tablet (40 mg total) by mouth daily.   lisinopril (ZESTRIL) 10 MG tablet Take 1 tablet (10 mg total) by mouth daily.   magnesium oxide  (MAG-OX) 400 MG tablet Take by mouth.   No facility-administered medications prior to visit.    Review of Systems {Insert previous labs (optional):23779} {See past labs  Heme  Chem  Endocrine  Serology  Results Review (optional):1}   Objective    There were no vitals taken for this visit. {Insert last BP/Wt (optional):23777}{See vitals history (optional):1}  Physical Exam  ***  No results found for any visits on 08/08/23.  Assessment & Plan    There are no diagnoses linked to this encounter.  ***  No follow-ups on file.       Alfredia Ferguson, PA-C  [facility] at [facility] [phone] (phone) [phone] (fax)  [facility]

## 2023-08-15 ENCOUNTER — Emergency Department (HOSPITAL_BASED_OUTPATIENT_CLINIC_OR_DEPARTMENT_OTHER)
Admission: EM | Admit: 2023-08-15 | Discharge: 2023-08-15 | Disposition: A | Discharge status: Home / Self Care | Attending: Emergency Medicine | Admitting: Emergency Medicine

## 2023-08-15 ENCOUNTER — Encounter (HOSPITAL_BASED_OUTPATIENT_CLINIC_OR_DEPARTMENT_OTHER): Payer: Self-pay | Admitting: Emergency Medicine

## 2023-08-15 ENCOUNTER — Other Ambulatory Visit: Payer: Self-pay

## 2023-08-15 DIAGNOSIS — L0501 Pilonidal cyst with abscess: Secondary | ICD-10-CM | POA: Insufficient documentation

## 2023-08-15 MED ORDER — DOXYCYCLINE HYCLATE 100 MG PO CAPS: 100.0000 mg | ORAL_CAPSULE | Freq: Two times a day (BID) | ORAL | 0 refills | Status: DC

## 2023-08-15 MED ORDER — DOXYCYCLINE HYCLATE 100 MG PO TABS
100.0000 mg | ORAL_TABLET | Freq: Once | ORAL | Status: AC
  Administered 2023-08-15: 100 mg via ORAL
  Filled 2023-08-15: qty 1

## 2023-08-15 MED ORDER — HYDROCODONE-ACETAMINOPHEN 5-325 MG PO TABS
2.0000 | ORAL_TABLET | Freq: Once | ORAL | Status: AC
  Administered 2023-08-15: 2 via ORAL
  Filled 2023-08-15: qty 2

## 2023-08-15 MED ORDER — LIDOCAINE HCL (PF) 1 % IJ SOLN
5.0000 mL | Freq: Once | INTRAMUSCULAR | Status: AC
  Administered 2023-08-15: 5 mL via INTRADERMAL
  Filled 2023-08-15: qty 5

## 2023-08-15 MED ORDER — HYDROCODONE-ACETAMINOPHEN 5-325 MG PO TABS: 1.0000 | ORAL_TABLET | Freq: Four times a day (QID) | ORAL | 0 refills | Status: DC | PRN

## 2023-08-15 NOTE — ED Triage Notes (Signed)
 Pt c/o abscess to top of buttocks x a few days, hx of same

## 2023-08-15 NOTE — Discharge Instructions (Signed)
 Begin taking doxycycline as prescribed.  Take ibuprofen 600 mg every 6 hours as needed for pain.  Begin taking hydrocodone as prescribed as needed for pain not relieved with ibuprofen.  Apply warm compresses as frequently as possible for the next several days.  Follow-up with primary doctor if not improving.

## 2023-08-15 NOTE — ED Provider Notes (Signed)
 [place] EM[facility] DEPARTMENT AT [facility] Provider Note   CSN: [id number] Arrival date & time: 08/15/23  0415     History  Chief Complaint  Patient presents with   Abscess    Matthew Buchanan is a 38 y.o. male.  Patient is a 38 year old male presenting with complaints of pilonidal cyst/abscess.  He has had this in the past and required incision and drainage.  It began swelling and becoming painful again 2 days ago.  No fevers or chills.  Pain worse with palpation and sitting.  No alleviating factors.  The history is provided by the patient.       Home Medications Prior to Admission medications   Medication Sig Start Date End Date Taking? Authorizing Provider  carvedilol (COREG) 12.5 MG tablet Take 1 tablet (12.5 mg total) by mouth 2 (two) times daily with a meal. 07/28/23   Drubel, Lillia Abed, PA-C  furosemide (LASIX) 40 MG tablet Take 1 tablet (40 mg total) by mouth daily. 07/28/23   Alfredia Ferguson, PA-C  lisinopril (ZESTRIL) 10 MG tablet Take 1 tablet (10 mg total) by mouth daily. 07/28/23   Alfredia Ferguson, PA-C  magnesium oxide (MAG-OX) 400 MG tablet Take by mouth. 06/12/22   [provider]      Allergies    Patient has no known allergies.    Review of Systems   Review of Systems  All other systems reviewed and are negative.   Physical Exam Updated Vital Signs BP 126/73 (BP Location: Left Arm)   Pulse 90   Temp 98.1 F (36.7 C) (Oral)   Resp 16   Ht 6' (1.829 m)   Wt 132.9 kg   SpO2 99%   BMI 39.74 kg/m  Physical Exam Vitals and nursing note reviewed.  Constitutional:      Appearance: Normal appearance.  Pulmonary:     Effort: Pulmonary effort is normal.  Genitourinary:    Comments: In the pilonidal region, there is a small 2 cm x 3 cm cyst noted with fluctuance and tenderness to palpation.  There is mild overlying erythema. Skin:    General: Skin is warm and dry.  Neurological:     Mental Status: He is alert.     ED Results /  Procedures / Treatments   Labs (all labs ordered are listed, but only abnormal results are displayed) Labs Reviewed - No data to display  EKG None  Radiology No results found.  Procedures Procedures   INCISION AND DRAINAGE Performed by: Geoffery Lyons Consent: Verbal consent obtained. Risks and benefits: risks, benefits and alternatives were discussed Type: abscess  Body area: Pilonidal  Anesthesia: local infiltration  Incision was made with a scalpel.  Local anesthetic: lidocaine 1% 2 epinephrine  Anesthetic total: 3 ml  Complexity: complex Blunt dissection to break up loculations  Drainage: purulent  Drainage amount: Moderate  Packing material: No packing placed  Patient tolerance: Patient tolerated the procedure well with no immediate complications.    Medications Ordered in ED Medications  lidocaine (PF) (XYLOCAINE) 1 % injection 5 mL (has no administration in time range)    ED Course/ Medical Decision Making/ A&P  Patient presenting with a recurrent cyst/pilonidal abscess.  This was incised and drained as above.  Patient to be discharged with doxycycline, warm compresses, pain medication, and follow-up as needed.  Final Clinical Impression(s) / ED Diagnoses Final diagnoses:  None    Rx / DC Orders ED Discharge Orders     None  Geoffery Lyons, MD 08/15/23 [phone]

## 2023-09-22 ENCOUNTER — Ambulatory Visit: Attending: Obstetrics and Gynecology

## 2023-09-22 DIAGNOSIS — Z31440 Encounter of male for testing for genetic disease carrier status for procreative management: Secondary | ICD-10-CM

## 2023-10-01 ENCOUNTER — Ambulatory Visit: Payer: Self-pay

## 2023-10-01 NOTE — Telephone Encounter (Signed)
  Chief Complaint: right knee pain Symptoms: right knee pain and swelling Frequency: x 1 month, worsening over past 2 weeks Pertinent Negatives: Patient's girlfriend denies known injury to right leg, redness, rash, fever, difficulty breathing, chest pain,  Disposition: [] ED /[] Urgent Care (no appt availability in office) / [x] Appointment(In office/virtual)/ []  [facility]/ [] Home Care/ [] Refused Recommended Disposition /[] [facility]/ []  Follow-up with PCP Additional Notes: Patient's girlfriend, Destiny, on the phone for triage. Patient was seen in ED and outpatient by sports medicine, had fluid drained out of his right knee about 2 weeks ago. She states the patient has been alternating between hot and cold and taking Tylenol . Patient states he was told by sports medicine to follow up with his PCP.  Copied from CRM [id number]. Topic: Clinical - Red Word Triage >> Oct 01, 2023  8:15 AM Howard Macho wrote: Reason for EAV:[id number] girlfriend called stating the patient went to the orthopedic doctor two weeks ago to get fluid drained from his right leg and now it is hard for him to walk on and it feels like a bone is floating in there Reason for Disposition  [1] Very swollen joint AND [2] no fever  Answer Assessment - Initial Assessment Questions 1. ONSET: "When did the pain start?"      X 1 month  2. LOCATION: "Where is the pain located?"      Right leg.  3. PAIN: "How bad is the pain?"    (Scale 1-10; or mild, moderate, severe)   -  MILD (1-3): doesn't interfere with normal activities    -  MODERATE (4-7): interferes with normal activities (e.g., work or school) or awakens from sleep, limping    -  SEVERE (8-10): excruciating pain, unable to do any normal activities, unable to walk     She states it mostly is a 9/10 and icing it helps sometimes, right now he is in the hot shower to help with pain. Patient has been treating with Tylenol , last dose about 2 days ago.  4. WORK OR  EXERCISE: "Has there been any recent work or exercise that involved this part of the body?"      Denies.  5. CAUSE: "What do you think is causing the leg pain?"     She states he had a history of cyst and fluid in his right knee.  6. OTHER SYMPTOMS: "Do you have any other symptoms?" (e.g., chest pain, back pain, breathing difficulty, swelling, rash, fever, numbness, weakness)     Right knee swelling with warmth.  7. PREGNANCY: "Is there any chance you are pregnant?" "When was your last menstrual period?"     N/A.  Protocols used: Leg Pain-A-AH, Knee Pain-A-AH

## 2023-10-02 ENCOUNTER — Ambulatory Visit: Admitting: Medical

## 2023-10-02 ENCOUNTER — Encounter: Payer: Self-pay | Admitting: Medical

## 2023-10-02 ENCOUNTER — Ambulatory Visit: Admitting: Physician Assistant

## 2023-10-02 VITALS — BP 144/88 | HR 100 | Resp 20 | Ht 72.0 in | Wt 286.0 lb

## 2023-10-02 DIAGNOSIS — M25561 Pain in right knee: Secondary | ICD-10-CM | POA: Diagnosis not present

## 2023-10-02 LAB — CBC WITH DIFFERENTIAL/PLATELET
Basophils Absolute: 0.1 10*3/uL (ref 0.0–0.1)
Basophils Relative: 0.7 % (ref 0.0–3.0)
Eosinophils Absolute: 0.1 10*3/uL (ref 0.0–0.7)
Eosinophils Relative: 1.5 % (ref 0.0–5.0)
HCT: 48 % (ref 39.0–52.0)
Hemoglobin: 16.1 g/dL (ref 13.0–17.0)
Lymphocytes Relative: 28.5 % (ref 12.0–46.0)
Lymphs Abs: 2.6 10*3/uL (ref 0.7–4.0)
MCHC: 33.5 g/dL (ref 30.0–36.0)
MCV: 95 fl (ref 78.0–100.0)
Monocytes Absolute: 1.1 10*3/uL — ABNORMAL HIGH (ref 0.1–1.0)
Monocytes Relative: 12.5 % — ABNORMAL HIGH (ref 3.0–12.0)
Neutro Abs: 5.2 10*3/uL (ref 1.4–7.7)
Neutrophils Relative %: 56.8 % (ref 43.0–77.0)
Platelets: 312 10*3/uL (ref 150.0–400.0)
RBC: 5.05 Mil/uL (ref 4.22–5.81)
RDW: 13.8 % (ref 11.5–15.5)
WBC: 9.1 10*3/uL (ref 4.0–10.5)

## 2023-10-02 LAB — HORIZON CUSTOM: REPORT SUMMARY: NEGATIVE

## 2023-10-02 LAB — URIC ACID: Uric Acid, Serum: 10.5 mg/dL — ABNORMAL HIGH (ref 4.0–7.8)

## 2023-10-02 MED ORDER — TRAMADOL HCL 50 MG PO TABS: ORAL_TABLET | ORAL | 0 refills | Status: DC

## 2023-10-02 MED ORDER — METHYLPREDNISOLONE 4 MG PO TABS: ORAL_TABLET | ORAL | 0 refills | Status: DC

## 2023-10-02 NOTE — Patient Instructions (Signed)
 Probable gout rt knee Gout flare in right knee with moderate to severe pain and swelling, less pain after steroid injection about 2 weeks ago. No systemic symptoms. No oral medication rx'd. - Order uric acid level and CBC. - Prescribe Medrol  4 mg, 6-day tapered dose. - Prescribe tramadol  for pain.(rx advisement given) - Consider antibiotics if CBC shows elevated infection markers.  Right knee pain with possible mild recurrent effusion. If so not obvious on exam Previous aspiration without analysis for uric acid crystals or infection markers that I can see. - Advise monitoring for increased swelling, fever, or chills and return to emergency department if these occur. -if very high wbc count would advise ED evaluation.  Elevated blood pressure Slightly elevated blood pressure likely secondary to pain from right knee condition. No immediate intervention required. Pain likely contributing to elevation. -continue current bp meds.  Follow up next tuesday with pcp or sooner if needed

## 2023-10-02 NOTE — Progress Notes (Signed)
 Subjective:    Patient ID: Matthew Buchanan, male    DOB: 06-03-1986, 38 y.o.   MRN: [id number]  HPI  Matthew Buchanan is a 38 year old male with a history of gout who presents with right knee pain. He is accompanied by his girlfriend.  Right knee pain began approximately one week ago without any recent injury or fall. He has a history of intermittent knee pain and was seen two weeks ago for similar symptoms. During that visit, he was diagnosed with gout in the knee, confirmed by fluid aspiration in the emergency department. An x-ray at that time showed a large effusion, and he received a steroid injection, which provided some relief by the next day, but the pain worsened three days later.  Currently, the rt  pain is described as moderate to severe, with the most intense pain localized to the medial portion of the knee. Swelling is present but not as severe as before. No fevers, chills, or sweats, although he mentions feeling hot at times. Previous workup includes an x-ray showing effusion and a fluid aspiration yielding 150 mL of clear yellow fluid. No uric acid level or fluid analysis for crystals was performed. His three-month sugar average was normal. His blood pressure today is noted to be a little high, which he attributes to the pain.     Review of Systems  Constitutional:  Negative for chills, fatigue and fever.  Respiratory:  Negative for cough, chest tightness, shortness of breath and wheezing.   Cardiovascular:  Negative for chest pain and palpitations.  Musculoskeletal:        Rt knee pain  Skin:  Negative for rash.  Neurological:  Negative for dizziness and light-headedness.  Hematological:  Negative for adenopathy. Does not bruise/bleed easily.  Psychiatric/Behavioral:  Negative for behavioral problems and decreased concentration.     Past Medical History:  Diagnosis Date   Hypertension      Social History   Socioeconomic History   Marital status: Single    Spouse name: Not on  file   Number of children: Not on file   Years of education: Not on file   Highest education level: Not on file  Occupational History   Not on file  Tobacco Use   Smoking status: Every Day    Types: Cigars   Smokeless tobacco: Never   Tobacco comments:    Pt has been smoking daily cigars since 2006.  Vaping Use   Vaping status: Never Used  Substance and Sexual Activity   Alcohol use: Yes    Comment: occ   Drug use: Yes    Types: Marijuana    Comment: occ   Sexual activity: Yes  Other Topics Concern   Not on file  Social History Narrative   Not on file   Social Drivers of Health   Financial Resource Strain: Medium Risk (12/19/2020)   Received from [facility] visits prior to 08/03/2022., [facility] visits prior to 08/03/2022.   Overall Financial Resource Strain (CARDIA)    Difficulty of Paying Living Expenses: Somewhat hard  Food Insecurity: No Food Insecurity (12/19/2020)   [facility] visits prior to 08/03/2022., [facility] visits prior to 08/03/2022.   Hunger Vital Sign    Worried About Running Out of Food in the Last Year: Never true    Ran Out of Food in the Last Year: Never true  Transportation Needs: No Transportation Needs (  12/19/2020)   [facility] visits prior to 08/03/2022., [facility] visits prior to 08/03/2022.   PRAPARE - Administrator, Civil Service (Medical): No    Lack of Transportation (Non-Medical): No  Physical Activity: Not on file  Stress: Not on file  Social Connections: Not on file  Intimate Partner Violence: Not on file    Past Surgical History:  Procedure Laterality Date   CHOLECYSTECTOMY  2022    Family History  Problem Relation Age of Onset   Hypertension Mother     No Known Allergies  Current Outpatient Medications on File Prior to Visit  Medication Sig Dispense Refill    carvedilol  (COREG ) 12.5 MG tablet Take 1 tablet (12.5 mg total) by mouth 2 (two) times daily with a meal. 180 tablet 0   doxycycline  (VIBRAMYCIN ) 100 MG capsule Take 1 capsule (100 mg total) by mouth 2 (two) times daily. One po bid x 7 days 14 capsule 0   furosemide  (LASIX ) 40 MG tablet Take 1 tablet (40 mg total) by mouth daily. 90 tablet 0   HYDROcodone -acetaminophen  (NORCO/VICODIN) 5-325 MG tablet Take 1-2 tablets by mouth every 6 (six) hours as needed. 10 tablet 0   lisinopril  (ZESTRIL ) 10 MG tablet Take 1 tablet (10 mg total) by mouth daily. 90 tablet 0   magnesium oxide (MAG-OX) 400 MG tablet Take by mouth.     No current facility-administered medications on file prior to visit.    BP (!) 144/88   Pulse 100   Resp 20   Ht 6' (1.829 m)   Wt 286 lb (129.7 kg)   SpO2 99%   BMI 38.79 kg/m        Objective:   Physical Exam  General Mental Status- Alert. General Appearance- Not in acute distress.   Skin General: Color- Normal Color. Moisture- Normal Moisture.  Neck . No JVD.  Chest and Lung Exam Auscultation: Breath Sounds:- CTA  Cardiovascular Auscultation:Rythm- RRR Murmurs & Other Heart Sounds:Auscultation of the heart reveals- No Murmurs.  Abdomen Inspection:-Inspeection Normal. Palpation/Percussion:Note:No mass. Palpation and Percussion of the abdomen reveal- Non Tender, Non Distended + BS, no rebound or guarding.   Neurologic Cranial Nerve exam:- CN III-XII intact(No nystagmus), symmetric smile. Strength:- 5/5 equal and symmetric strength both upper and lower extremities.   Lower ext bilateral- symmetric calves, no pedal edema, negative homans sign.   Rt knee- mild swollen. Faint warmth, no rash, no fluctuance. Medial knee very tender to light palpation. No induration. Left knee normal.    Assessment & Plan:   Patient Instructions  Probable gout rt knee Gout flare in right knee with moderate to severe pain and swelling, less pain after steroid  injection about 2 weeks ago. No systemic symptoms. No oral medication rx'd. - Order uric acid level and CBC. - Prescribe Medrol  4 mg, 6-day tapered dose. - Prescribe tramadol  for pain.(rx advisement given) - Consider antibiotics if CBC shows elevated infection markers.  Right knee pain with possible mild recurrent effusion. If so not obvious on exam Previous aspiration without analysis for uric acid crystals or infection markers that I can see. - Advise monitoring for increased swelling, fever, or chills and return to emergency department if these occur. -if very high wbc count would advise ED evaluation.  Elevated blood pressure Slightly elevated blood pressure likely secondary to pain from right knee condition. No immediate intervention required. Pain likely contributing to elevation. -continue current bp meds.  Follow up  next tuesday with pcp or sooner if needed

## 2023-10-07 NOTE — Progress Notes (Deleted)
      Established patient visit   Patient: Matthew Buchanan   DOB: 1985/10/01   38 y.o. Male  MRN: [id number] Visit Date: 10/09/2023  Today's healthcare provider: Trenton Frock, PA-C   No chief complaint on file.  Subjective     Pt was seen 5/1 for right knee pain, confirmed gout w/ fluid aspiration. Pt was treated further with prednisone and tramadol .   Uric acid was 10.5  Medications: Outpatient Medications Prior to Visit  Medication Sig   carvedilol  (COREG ) 12.5 MG tablet Take 1 tablet (12.5 mg total) by mouth 2 (two) times daily with a meal.   doxycycline  (VIBRAMYCIN ) 100 MG capsule Take 1 capsule (100 mg total) by mouth 2 (two) times daily. One po bid x 7 days   furosemide  (LASIX ) 40 MG tablet Take 1 tablet (40 mg total) by mouth daily.   lisinopril  (ZESTRIL ) 10 MG tablet Take 1 tablet (10 mg total) by mouth daily.   magnesium oxide (MAG-OX) 400 MG tablet Take by mouth.   methylPREDNISolone  (MEDROL ) 4 MG tablet Standard 6 day taper dose pack   traMADol  (ULTRAM ) 50 MG tablet 1 tab po q 8 hours prn severe pain   No facility-administered medications prior to visit.    Review of Systems {Insert previous labs (optional):23779} {See past labs  Heme  Chem  Endocrine  Serology  Results Review (optional):1}   Objective    There were no vitals taken for this visit. {Insert last BP/Wt (optional):23777}{See vitals history (optional):1}  Physical Exam  ***  No results found for any visits on 10/09/23.  Assessment & Plan    There are no diagnoses linked to this encounter.  ***  No follow-ups on file.       Trenton Frock, PA-C  [facility] at [facility] [phone] (phone) [phone] (fax)  [facility]

## 2023-10-09 ENCOUNTER — Encounter: Payer: Self-pay | Admitting: Sports Medicine

## 2023-10-09 ENCOUNTER — Ambulatory Visit: Admitting: Physician Assistant

## 2023-10-09 ENCOUNTER — Encounter: Payer: Self-pay | Admitting: Physician Assistant

## 2023-10-09 ENCOUNTER — Ambulatory Visit (INDEPENDENT_AMBULATORY_CARE_PROVIDER_SITE_OTHER): Admitting: Physician Assistant

## 2023-10-09 ENCOUNTER — Other Ambulatory Visit: Payer: Self-pay | Admitting: Sports Medicine

## 2023-10-09 ENCOUNTER — Ambulatory Visit: Admitting: Sports Medicine

## 2023-10-09 VITALS — BP 130/80 | Ht 72.0 in | Wt 281.0 lb

## 2023-10-09 VITALS — BP 165/97 | HR 86 | Ht 72.0 in | Wt 281.8 lb

## 2023-10-09 DIAGNOSIS — M25561 Pain in right knee: Secondary | ICD-10-CM | POA: Diagnosis present

## 2023-10-09 DIAGNOSIS — M1A0610 Idiopathic chronic gout, right knee, without tophus (tophi): Secondary | ICD-10-CM | POA: Diagnosis not present

## 2023-10-09 MED ORDER — TRAMADOL HCL 50 MG PO TABS: ORAL_TABLET | ORAL | 0 refills | Status: AC

## 2023-10-09 MED ORDER — COLCHICINE 0.6 MG PO TABS: 0.6000 mg | ORAL_TABLET | Freq: Two times a day (BID) | ORAL | 0 refills | Status: DC | PRN

## 2023-10-09 MED ORDER — METHYLPREDNISOLONE ACETATE 40 MG/ML IJ SUSP
40.0000 mg | Freq: Once | INTRAMUSCULAR | Status: AC
  Administered 2023-10-09: 40 mg via INTRA_ARTICULAR

## 2023-10-09 MED ORDER — ALLOPURINOL 100 MG PO TABS
100.0000 mg | ORAL_TABLET | Freq: Every day | ORAL | 1 refills | Status: DC
Start: 2023-10-09 — End: 2023-10-31

## 2023-10-09 NOTE — Progress Notes (Signed)
   Subjective:    Patient ID: Matthew Buchanan, male    DOB: [birth year], 38 y.o.   MRN: [id number]  HPI chief complaint: Right knee pain and swelling  Matthew Buchanan is a pleasant 38 year old male that presents today with right knee pain and swelling that began acutely without trauma approximately 3 weeks ago.  He was initially seen by sports medicine provider at another healthcare system.  According to that note his knee was aspirated and 150 cc of fluid were obtained and he was injected with cortisone but it does not appear that that fluid was sent off for analysis.  His pain did improve initially after that procedure but has now returned.  Pain is diffuse throughout the knee.  No fevers or chills.  No history of gout.  No prior knee surgery.  He was seen earlier today by his primary care provider who referred him to our office for possible aspiration and injection.  Past medical history reviewed Medications reviewed Allergies reviewed   Review of Systems As above    Objective:   Physical Exam  Right knee: Moderate effusion with limited range of motion.  Knee is slightly warm to touch but nonerythematous.  Remainder of the knee exam was deferred due to pain.  2 sets of x-rays have been done recently, one at the original sports medicine provider's office and a second x-ray in the emergency room yesterday.  Per those progress notes those x-rays were unremarkable.    Assessment & Plan:   Right knee pain and swelling  Right knee was aspirated.  An anterior lateral approach is utilized.  Under sterile technique, 3 cc of 1% Xylocaine  were used to numb a track into the superior lateral suprapatellar pouch.  This was followed by an introduction of an 18-gauge needle which yielded 35 cc of straw-colored fluid on aspiration.  A mixture of 3 cc of 1% Xylocaine  and 1 cc (40 mg Depo-Medrol ) was injected and the needle removed.  He tolerates this without difficulty.  His PCP prescribed allopurinol for him but I have  instructed him not to take that in case this is an acute gouty attack.  Allopurinol can make that worse.  Instead, I have provided him with a prescription for colchicine  0.6 mg to take twice daily.  Knee aspirate was sent off for crystal analysis, Gram stain, culture, and cell count with differential.  Prescription was also provided for tramadol  to take as needed for pain.  He may alternate this with Tylenol .  He will follow-up with me in the office in 1 week.  If lab results do not show gout or infection then we will need to order an MRI at that time.  An Ace wrap was provided for him to compress the knee during the day.  He is instructed not to sleep with the Ace wrap on at night.  This note was dictated using Dragon naturally speaking software and may contain errors in syntax, spelling, or content which have not been identified prior to signing this note.

## 2023-10-09 NOTE — Progress Notes (Signed)
 Established patient visit   Patient: Matthew Buchanan   DOB: May 21, 1986   37 y.o. Male  MRN: [id number] Visit Date: 10/09/2023  Today's healthcare provider: Trenton Frock, PA-C   Cc. Right knee pain  Subjective     Pt was seen 5/1 for right knee pain, confirmed gout w/ fluid aspiration. Pt was treated further with prednisone and tramadol .  He was seen back in the ER 5/5 who ordered meloxicam.   Uric acid was 10.5.  Pt reports persistent right knee pain, swelling, unable to ambulate or bend knee.   Medications: Outpatient Medications Prior to Visit  Medication Sig   carvedilol  (COREG ) 12.5 MG tablet Take 1 tablet (12.5 mg total) by mouth 2 (two) times daily with a meal.   furosemide  (LASIX ) 40 MG tablet Take 1 tablet (40 mg total) by mouth daily.   lisinopril  (ZESTRIL ) 10 MG tablet Take 1 tablet (10 mg total) by mouth daily.   magnesium oxide (MAG-OX) 400 MG tablet Take by mouth.   traMADol  (ULTRAM ) 50 MG tablet 1 tab po q 8 hours prn severe pain   [DISCONTINUED] doxycycline  (VIBRAMYCIN ) 100 MG capsule Take 1 capsule (100 mg total) by mouth 2 (two) times daily. One po bid x 7 days   [DISCONTINUED] methylPREDNISolone  (MEDROL ) 4 MG tablet Standard 6 day taper dose pack   No facility-administered medications prior to visit.    Review of Systems  Constitutional:  Negative for fatigue and fever.  Respiratory:  Negative for cough and shortness of breath.   Cardiovascular:  Negative for chest pain, palpitations and leg swelling.  Musculoskeletal:  Positive for arthralgias and joint swelling.  Neurological:  Negative for dizziness and headaches.       Objective    BP (!) 165/97   Pulse 86   Ht 6' (1.829 m)   Wt 281 lb 12.8 oz (127.8 kg)   BMI 38.22 kg/m    Physical Exam Vitals reviewed.  Constitutional:      Appearance: He is not ill-appearing.  HENT:     Head: Normocephalic.  Eyes:     Conjunctiva/sclera: Conjunctivae normal.  Cardiovascular:     Rate and  Rhythm: Normal rate.  Pulmonary:     Effort: Pulmonary effort is normal. No respiratory distress.  Musculoskeletal:     Comments: Right knee with slightly fluctuant effusion, hot to touch, edema around joint.  Pt unable to complete any Rom without pain  Neurological:     Mental Status: He is alert and oriented to person, place, and time.  Psychiatric:        Mood and Affect: Mood normal.        Behavior: Behavior normal.      No results found for any visits on 10/09/23.  Assessment & Plan    Chronic gout of right knee, unspecified cause -     Allopurinol; Take 1 tablet (100 mg total) by mouth daily.  Dispense: 30 tablet; Refill: 1 -     Uric acid; Future -     Basic metabolic panel with GFR; Future -     CBC with Differential/Platelet; Future   Coordinated same day appt w/ sports med to have drained.  Starting allopurinol 100 mg, recommending f/u in 1 week for repeat labs and will titrate as indicated.  Pt also due for f/u of other chronic health conditons  Return if symptoms worsen or fail to improve.       Trenton Frock, PA-C  [place]  Primary Care at [facility] [phone] (phone) [phone] (fax)  [facility]

## 2023-10-10 ENCOUNTER — Telehealth: Payer: Self-pay

## 2023-10-10 NOTE — Telephone Encounter (Signed)
 Called back, left message

## 2023-10-10 NOTE — Telephone Encounter (Signed)
 Copied from CRM [id number]. Topic: Clinical - Medical Advice >> Oct 10, 2023 11:29 AM Artemio Larry wrote: Reason for CRM: Gwyn Leos from [facility] requesting call back from Trenton Frock at [phone] regarding patients medication and knee.

## 2023-10-14 LAB — CELL COUNT AND DIFF, FLUID, OTHER
Basophils, %: 0 %
Eosinophils, %: 0 %
Lymphocytes, %: 2 %
Mesothelial, %: 0 %
Monocyte/Macrophage %: 1 %
Neutrophils, %: 97 %
Total Nucleated Cell Ct: 1100 {cells}/uL

## 2023-10-14 LAB — ANAEROBIC AND AEROBIC CULTURE
AER RESULT:: NO GROWTH
MICRO NUMBER:: 16430347
MICRO NUMBER:: 16430346
SPECIMEN QUALITY:: ADEQUATE
SPECIMEN QUALITY:: ADEQUATE

## 2023-10-14 LAB — EXTRA SPECIMEN

## 2023-10-14 LAB — TEST AUTHORIZATION

## 2023-10-14 LAB — SYNOVIAL FLUID, CRYSTAL

## 2023-10-15 NOTE — Progress Notes (Deleted)
      Established patient visit   Patient: Matthew Buchanan   DOB: 22-Jan-1986   37 y.o. Male  MRN: [id number] Visit Date: 10/16/2023  Today's healthcare provider: Trenton Frock, PA-C   No chief complaint on file.  Subjective     Pt presents today for follow up on chronic gout . He saw ortho who drained his knee, no crystals were identified, bacterial cultures negative.  Medications: Outpatient Medications Prior to Visit  Medication Sig   allopurinol  (ZYLOPRIM ) 100 MG tablet Take 1 tablet (100 mg total) by mouth daily.   carvedilol  (COREG ) 12.5 MG tablet Take 1 tablet (12.5 mg total) by mouth 2 (two) times daily with a meal.   colchicine  0.6 MG tablet Take 1 tablet (0.6 mg total) by mouth 2 (two) times daily as needed.   furosemide  (LASIX ) 40 MG tablet Take 1 tablet (40 mg total) by mouth daily.   lisinopril  (ZESTRIL ) 10 MG tablet Take 1 tablet (10 mg total) by mouth daily.   magnesium oxide (MAG-OX) 400 MG tablet Take by mouth.   traMADol  (ULTRAM ) 50 MG tablet 1 tab po q 8 hours prn severe pain   No facility-administered medications prior to visit.    Review of Systems {Insert previous labs (optional):23779} {See past labs  Heme  Chem  Endocrine  Serology  Results Review (optional):1}   Objective    There were no vitals taken for this visit. {Insert last BP/Wt (optional):23777}{See vitals history (optional):1}  Physical Exam  ***  No results found for any visits on 10/16/23.  Assessment & Plan    There are no diagnoses linked to this encounter.  ***  No follow-ups on file.       Trenton Frock, PA-C  [facility] Primary Care at [facility] [phone] (phone) [phone] (fax)  [facility]

## 2023-10-16 ENCOUNTER — Ambulatory Visit: Admitting: Physician Assistant

## 2023-10-16 ENCOUNTER — Ambulatory Visit (INDEPENDENT_AMBULATORY_CARE_PROVIDER_SITE_OTHER): Admitting: Sports Medicine

## 2023-10-16 ENCOUNTER — Encounter: Payer: Self-pay | Admitting: Sports Medicine

## 2023-10-16 VITALS — BP 138/98 | Ht 72.0 in | Wt 281.0 lb

## 2023-10-16 DIAGNOSIS — M25561 Pain in right knee: Secondary | ICD-10-CM | POA: Diagnosis present

## 2023-10-16 NOTE — Progress Notes (Signed)
   Subjective:    Patient ID: Matthew Buchanan, male    DOB: 11/16/1985, 38 y.o.   MRN: [id number]  HPI Herbert presents today for follow-up on acute atraumatic right knee pain and swelling.  Pain has improved dramatically after cortisone and starting colchicine  twice daily.  His knee aspirate showed no signs of infection.  Interestingly, no signs of gout either.  A review of his chart shows that a previous knee aspirate did show monosodium urate crystals.  He states he is approximately 70% improved since last week.   Review of Systems As above    Objective:   Physical Exam  Right knee: Good range of motion.  No visible effusion.  Not erythematous.  Not warm to touch.  No tenderness to palpation.      Assessment & Plan:   Resolving right knee effusion  Given his overall improvement and atraumatic nature of his swelling I do not think that further imaging is needed at this time.  However, if he does not continue on to a full recovery, he will notify me and we will reconsider merits of MRI at that time.  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  He has colchicine  to take in the future if needed.  He will likely need to start allopurinol  in the future as well.  He will follow-up with his PCP to discuss this.  Follow-up with me as needed.  This note was dictated using Dragon naturally speaking software and may contain errors in syntax, spelling, or content which have not been identified prior to signing this note.

## 2023-10-26 ENCOUNTER — Other Ambulatory Visit: Payer: Self-pay | Admitting: Physician Assistant

## 2023-10-26 DIAGNOSIS — I10 Essential (primary) hypertension: Secondary | ICD-10-CM

## 2023-10-26 DIAGNOSIS — I5022 Chronic systolic (congestive) heart failure: Secondary | ICD-10-CM

## 2023-10-31 ENCOUNTER — Other Ambulatory Visit: Payer: Self-pay | Admitting: Physician Assistant

## 2023-10-31 DIAGNOSIS — M1A0610 Idiopathic chronic gout, right knee, without tophus (tophi): Secondary | ICD-10-CM

## 2023-11-03 ENCOUNTER — Other Ambulatory Visit: Payer: Self-pay | Admitting: *Deleted

## 2023-11-03 DIAGNOSIS — M25561 Pain in right knee: Secondary | ICD-10-CM

## 2023-11-03 MED ORDER — COLCHICINE 0.6 MG PO TABS
0.6000 mg | ORAL_TABLET | Freq: Two times a day (BID) | ORAL | 0 refills | Status: DC | PRN
Start: 2023-11-03 — End: 2023-11-05

## 2023-11-05 ENCOUNTER — Other Ambulatory Visit: Payer: Self-pay | Admitting: *Deleted

## 2023-11-05 DIAGNOSIS — M25561 Pain in right knee: Secondary | ICD-10-CM

## 2023-11-05 MED ORDER — COLCHICINE 0.6 MG PO TABS: 0.6000 mg | ORAL_TABLET | Freq: Two times a day (BID) | ORAL | 0 refills | Status: AC | PRN

## 2023-11-05 NOTE — Telephone Encounter (Signed)
 Patient called asking if he can take colchicine  0.6 for his foot/ankle pain. He states the pain he is having in his foot/ankle feels like the gout pain he had in his knee. I informed patient he can take colchicine  twice daily for a few days and see if his symptoms resolve. Per Dr. Lovenia Ruby, I informed him if the medicine does not help his pain, he needs to have his foot evaluated. Patient verbalized understanding.

## 2023-11-20 ENCOUNTER — Ambulatory Visit: Admitting: Family Medicine

## 2023-12-05 ENCOUNTER — Other Ambulatory Visit: Payer: Self-pay | Admitting: Physician Assistant

## 2023-12-05 DIAGNOSIS — I10 Essential (primary) hypertension: Secondary | ICD-10-CM

## 2023-12-05 DIAGNOSIS — I5022 Chronic systolic (congestive) heart failure: Secondary | ICD-10-CM

## 2024-02-03 ENCOUNTER — Other Ambulatory Visit: Payer: Self-pay | Admitting: Obstetrics & Gynecology

## 2024-02-03 DIAGNOSIS — A599 Trichomoniasis, unspecified: Secondary | ICD-10-CM

## 2024-02-03 MED ORDER — METRONIDAZOLE 500 MG PO TABS
500.0000 mg | ORAL_TABLET | Freq: Two times a day (BID) | ORAL | 0 refills | Status: AC
Start: 2024-02-03 — End: 2024-02-10

## 2024-02-03 NOTE — Progress Notes (Signed)
 Prescription for Metronidazole  written for patient/expedited partner treatment of trichomonal infection. This was done as per his request,  Micky Overturf, MD

## 2024-02-16 ENCOUNTER — Telehealth: Payer: Self-pay | Admitting: Physician Assistant

## 2024-02-16 NOTE — Telephone Encounter (Signed)
 Copied from CRM [id number]. Topic: General - Other >> Feb 16, 2024  3:14 PM Matthew Buchanan wrote: Reason for CRM: Patient needs the from sent to the office from [facility], to be filled out and faxed back in for consent of care , ASAP

## 2024-02-18 ENCOUNTER — Encounter: Admitting: Family Medicine

## 2024-02-18 DIAGNOSIS — Z0000 Encounter for general adult medical examination without abnormal findings: Secondary | ICD-10-CM

## 2024-02-24 ENCOUNTER — Other Ambulatory Visit: Payer: Self-pay | Admitting: Sports Medicine

## 2024-02-24 DIAGNOSIS — M25561 Pain in right knee: Secondary | ICD-10-CM

## 2024-02-25 NOTE — Telephone Encounter (Signed)
 Copied from CRM [id number]. Topic: General - Other >> Feb 25, 2024  4:30 PM Zebedee SAUNDERS wrote: Reason for CRM: Received call from pt [facility] sent via fax for release for treatment. It was sent 3 weeks ago. Please call pt at [phone]

## 2024-02-26 NOTE — Telephone Encounter (Signed)
 Matthew Buchanan notifying pt that we have not received any form from [facility] and that he would need an appointment for anything to be filled out since Manuelita is no longer at this practice.

## 2024-05-11 ENCOUNTER — Encounter: Payer: Self-pay | Admitting: Student

## 2024-05-11 NOTE — Progress Notes (Signed)
 Subjective:     Patient ID: Matthew Buchanan, male    DOB: [birth year], 38 y.o.   MRN: [id number]  No chief complaint on file.   HPI   ERROR- Pt late for appointment, rescheduled.  Keeping note incase RTC   *check Uric acid  Discussed the use of AI scribe software for clinical note transcription with the patient, who gave verbal consent to proceed.  History of Present Illness         Treveon Bourcier presents for [facility] appointment.  Follows with: Sports medicine,  PMHx-CHF, HTN, gout  Social history-smokes cigars daily, marijuana use, EtOH??  Psx-cholecystectomy  Following with cardiology???  CHF- Lasix  40 mg daily,  TTE 2023-LVEF 20 to 25%  HTN-lisinopril  10 mg daily, carvedilol  12.5 mg twice daily  Gout-allopurinol  100 mg daily Lab Results  Component Value Date   LABURIC 10.5 (H) 10/02/2023    Patient denies fever, chills, SOB, CP, palpitations, dyspnea, edema, HA, vision changes, N/V/D, abdominal pain, urinary symptoms, rash, weight changes, and recent illness or hospitalizations.    Health Maintenance Due  Topic Date Due   Pneumococcal Vaccine (1 of 2 - PCV) Never done   DTaP/Tdap/Td (7 - Tdap) 11/12/2007   Influenza Vaccine  Never done    Past Medical History:  Diagnosis Date   CHF (congestive heart failure) (HCC)    Gout    Hypertension     Past Surgical History:  Procedure Laterality Date   CHOLECYSTECTOMY  2022    Family History  Problem Relation Age of Onset   Hypertension Mother     Social History   Socioeconomic History   Marital status: Single    Spouse name: Not on file   Number of children: Not on file   Years of education: Not on file   Highest education level: Not on file  Occupational History   Not on file  Tobacco Use   Smoking status: Every Day    Types: Cigars   Smokeless tobacco: Never   Tobacco comments:    Pt has been smoking daily cigars since 2006.  Vaping Use   Vaping status: Never Used  Substance and Sexual  Activity   Alcohol use: Yes    Comment: occ   Drug use: Yes    Types: Marijuana    Comment: occ   Sexual activity: Yes  Other Topics Concern   Not on file  Social History Narrative   Not on file   Social Drivers of Health   Tobacco Use: High Risk (03/21/2024)   Received from [facility]   Patient History    Smoking Tobacco Use: Every Day    Smokeless Tobacco Use: Never    Passive Exposure: Not on file  Financial Resource Strain: Not on file  Food Insecurity: Low Risk (03/21/2024)   Received from [facility]   Epic    Within the past 12 months, you worried that your food would run out before you got money to buy more: Never true    Within the past 12 months, the food you bought just didn't last and you didn't have money to get more. : Never true  Transportation Needs: No Transportation Needs (03/21/2024)   Received from [facility]    In the past 12 months, has lack of reliable transportation kept you from medical appointments, meetings, work or from getting things needed for daily living? : No  Physical Activity: Not on file  Stress: Not on file  Social Connections:  Not on file  Intimate Partner Violence: Not on file  Depression [phone]): Low Risk (07/18/2023)   Depression (PHQ2-9)    PHQ-2 Score: 0  Alcohol Screen: Not on file  Housing: Low Risk (03/21/2024)   Received from [facility]   Epic    What is your living situation today?: I have a steady place to live    Think about the place you live. Do you have problems with any of the following? Choose all that apply:: None/None on this list  Utilities: Low Risk (03/21/2024)   Received from [facility]   Utilities    In the past 12 months has the electric, gas, oil, or water company threatened to shut off services in your home? : No  Health Literacy: Not on file    Outpatient Medications Prior to Visit  Medication Sig Dispense Refill   allopurinol  (ZYLOPRIM ) 100 MG tablet Take 1 tablet (100  mg total) by mouth daily. 90 tablet 0   carvedilol  (COREG ) 12.5 MG tablet Take 1 tablet (12.5 mg total) by mouth 2 (two) times daily with a meal. 180 tablet 0   colchicine  0.6 MG tablet Take 1 tablet (0.6 mg total) by mouth 2 (two) times daily as needed (for pain). 180 tablet 0   furosemide  (LASIX ) 40 MG tablet Take 1 tablet (40 mg total) by mouth daily. 90 tablet 0   lisinopril  (ZESTRIL ) 10 MG tablet Take 1 tablet (10 mg total) by mouth daily. 90 tablet 0   magnesium oxide (MAG-OX) 400 MG tablet Take by mouth.     traMADol  (ULTRAM ) 50 MG tablet 1 tab po q 8 hours prn severe pain 20 tablet 0   No facility-administered medications prior to visit.    No Known Allergies  ROS     Objective:    Physical Exam   There were no vitals taken for this visit. Wt Readings from Last 3 Encounters:  10/16/23 281 lb (127.5 kg)  10/09/23 281 lb (127.5 kg)  10/09/23 281 lb 12.8 oz (127.8 kg)       Assessment & Plan:   Problem List Items Addressed This Visit       Cardiovascular and Mediastinum   Chronic systolic heart failure (HCC)   Primary hypertension     Other   Prediabetes - Primary   Tobacco use   Other Visit Diagnoses       No-show for appointment         Chronic gout of right knee, unspecified cause           I am having [employer] maintain his magnesium oxide, traMADol , allopurinol , colchicine , lisinopril , carvedilol , and furosemide .  No orders of the defined types were placed in this encounter.

## 2024-05-13 ENCOUNTER — Ambulatory Visit: Admitting: Student

## 2024-05-13 ENCOUNTER — Ambulatory Visit: Payer: Self-pay | Admitting: Student

## 2024-05-13 ENCOUNTER — Encounter: Payer: Self-pay | Admitting: Student

## 2024-05-13 VITALS — BP 163/111 | HR 89 | Ht 72.0 in | Wt 299.0 lb

## 2024-05-13 DIAGNOSIS — I5022 Chronic systolic (congestive) heart failure: Secondary | ICD-10-CM

## 2024-05-13 DIAGNOSIS — Z720 Tobacco use: Secondary | ICD-10-CM

## 2024-05-13 DIAGNOSIS — R7303 Prediabetes: Secondary | ICD-10-CM

## 2024-05-13 DIAGNOSIS — I10 Essential (primary) hypertension: Secondary | ICD-10-CM

## 2024-05-13 DIAGNOSIS — M1A0610 Idiopathic chronic gout, right knee, without tophus (tophi): Secondary | ICD-10-CM

## 2024-05-13 LAB — CBC
HCT: 42.6 % (ref 39.0–52.0)
Hemoglobin: 14.4 g/dL (ref 13.0–17.0)
MCHC: 33.7 g/dL (ref 30.0–36.0)
MCV: 92.1 fl (ref 78.0–100.0)
Platelets: 199 K/uL (ref 150.0–400.0)
RBC: 4.63 Mil/uL (ref 4.22–5.81)
RDW: 14.2 % (ref 11.5–15.5)
WBC: 6 K/uL (ref 4.0–10.5)

## 2024-05-13 LAB — COMPREHENSIVE METABOLIC PANEL WITH GFR
ALT: 22 U/L (ref 0–53)
AST: 13 U/L (ref 0–37)
Albumin: 4.1 g/dL (ref 3.5–5.2)
Alkaline Phosphatase: 54 U/L (ref 39–117)
BUN: 10 mg/dL (ref 6–23)
CO2: 26 meq/L (ref 19–32)
Calcium: 9.3 mg/dL (ref 8.4–10.5)
Chloride: 106 meq/L (ref 96–112)
Creatinine, Ser: 1.12 mg/dL (ref 0.40–1.50)
GFR: 83.52 mL/min (ref >60.00)
Glucose, Bld: 159 mg/dL — ABNORMAL HIGH (ref 70–99)
Potassium: 3.7 meq/L (ref 3.5–5.1)
Sodium: 140 meq/L (ref 135–145)
Total Bilirubin: 0.3 mg/dL (ref 0.2–1.2)
Total Protein: 6.9 g/dL (ref 6.0–8.3)

## 2024-05-13 LAB — HEPATIC FUNCTION PANEL
ALT: 22 U/L (ref 0–53)
AST: 13 U/L (ref 0–37)
Albumin: 4.1 g/dL (ref 3.5–5.2)
Alkaline Phosphatase: 54 U/L (ref 39–117)
Bilirubin, Direct: 0.1 mg/dL (ref 0.0–0.3)
Total Bilirubin: 0.3 mg/dL (ref 0.2–1.2)
Total Protein: 6.9 g/dL (ref 6.0–8.3)

## 2024-05-13 LAB — HEMOGLOBIN A1C: Hgb A1c MFr Bld: 5.8 % (ref 4.6–6.5)

## 2024-05-13 LAB — URIC ACID: Uric Acid, Serum: 8.8 mg/dL — ABNORMAL HIGH (ref 4.0–7.8)

## 2024-05-13 MED ORDER — LISINOPRIL 20 MG PO TABS: 20.0000 mg | ORAL_TABLET | Freq: Every day | ORAL | 1 refills | Status: AC

## 2024-05-13 NOTE — Assessment & Plan Note (Signed)
 Encouraged DASH or MIND diet, decrease po intake and increase exercise as tolerated. Needs 7-8 hours of sleep nightly. Avoid trans fats, eat small, frequent meals every 4-5 hours with lean proteins, complex carbs and healthy fats. Minimize simple carbs, high fat foods and processed foods

## 2024-05-13 NOTE — Assessment & Plan Note (Signed)
 hgba1c acceptable, minimize simple carbs. Increase exercise as tolerated.

## 2024-05-13 NOTE — Progress Notes (Addendum)
 Subjective:     Patient ID: Matthew Buchanan, male    DOB: 1985/10/29, 38 y.o.   MRN: [id number]  No chief complaint on file.   HPI     Discussed the use of AI scribe software for clinical note transcription with the patient, who gave verbal consent to proceed.  History of Present Illness         Matthew Buchanan presents for [facility] appointment.  He is single with 5 children, 2 of which live with him currently. He works full time  He has had persistently elevated home blood pressure readings around 166/118 mmHg and mid 150s systolic. A recent dental procedure was stopped due to high blood pressure. He has been out of carvedilol , Lasix , and lisinopril  for about a week. He notes his blood pressure was still elevated even when taking these medications.  He has heart failure with intermittent shortness of breath that varies day to day. He has not seen a cardiologist for a long time. He recently noticed fluid in his chest in addition to his usual leg and knee swelling. He has gout treated with colchicine  as needed for flares. His last flare was about six weeks ago in right toe, improved with colchicine . He has not been taking allopurinol .  He drinks alcohol about once a week, uses marijuana, and smokes black and mild cigars daily since 2006 with only a brief quit period. He works in sales promotion account executive.    PMHx-CHF, HTN, gout  Psx-cholecystectomy  CHF- Lasix  40 mg daily,  TTE 2023-LVEF 20 to 25%  HTN-lisinopril  10 mg daily, carvedilol  12.5 mg twice daily  Gout-allopurinol  100 mg daily, he has not been taking this medication Lab Results  Component Value Date   LABURIC 10.5 (H) 10/02/2023    Patient denies fever, chills, SOB, CP, palpitations, dyspnea, edema, HA, vision changes, N/V/D, abdominal pain, urinary symptoms, rash, weight changes, and recent illness or hospitalizations.    Health Maintenance Due  Topic Date Due   DTaP/Tdap/Td (7 - Tdap) 11/12/2007     Past Medical History:  Diagnosis Date   CHF (congestive heart failure) (HCC)    Gout    Hypertension     Past Surgical History:  Procedure Laterality Date   CHOLECYSTECTOMY  2022    Family History  Problem Relation Age of Onset   Hypertension Mother    Prostate cancer Neg Hx    Colon cancer Neg Hx     Social History   Socioeconomic History   Marital status: Single    Spouse name: Not on file   Number of children: Not on file   Years of education: Not on file   Highest education level: Not on file  Occupational History   Not on file  Tobacco Use   Smoking status: Every Day    Types: Cigars   Smokeless tobacco: Never   Tobacco comments:    Pt has been smoking daily cigars since 2006.  Vaping Use   Vaping status: Never Used  Substance and Sexual Activity   Alcohol use: Yes    Comment: occ, once per week   Drug use: Yes    Types: Marijuana    Comment: occ   Sexual activity: Yes  Other Topics Concern   Not on file  Social History Narrative   Not on file   Social Drivers of Health   Tobacco Use: High Risk (05/13/2024)   Patient History    Smoking Tobacco Use: Every Day  Smokeless Tobacco Use: Never    Passive Exposure: Not on file  Financial Resource Strain: Not on file  Food Insecurity: Low Risk (03/21/2024)   Received from [facility]   Epic    Within the past 12 months, you worried that your food would run out before you got money to buy more: Never true    Within the past 12 months, the food you bought just didn't last and you didn't have money to get more. : Never true  Transportation Needs: No Transportation Needs (03/21/2024)   Received from [facility]    In the past 12 months, has lack of reliable transportation kept you from medical appointments, meetings, work or from getting things needed for daily living? : No  Physical Activity: Not on file  Stress: Not on file  Social Connections: Not on file  Intimate Partner  Violence: Not on file  Depression (PHQ2-9): Low Risk (05/13/2024)   Depression (PHQ2-9)    PHQ-2 Score: 0  Alcohol Screen: Not on file  Housing: Low Risk (03/21/2024)   Received from [facility]   Epic    What is your living situation today?: I have a steady place to live    Think about the place you live. Do you have problems with any of the following? Choose all that apply:: None/None on this list  Utilities: Low Risk (03/21/2024)   Received from [facility]   Utilities    In the past 12 months has the electric, gas, oil, or water company threatened to shut off services in your home? : No  Health Literacy: Not on file    Outpatient Medications Prior to Visit  Medication Sig Dispense Refill   allopurinol  (ZYLOPRIM ) 100 MG tablet Take 1 tablet (100 mg total) by mouth daily. 90 tablet 0   colchicine  0.6 MG tablet Take 1 tablet (0.6 mg total) by mouth 2 (two) times daily as needed (for pain). 180 tablet 0   magnesium oxide (MAG-OX) 400 MG tablet Take by mouth.     traMADol  (ULTRAM ) 50 MG tablet 1 tab po q 8 hours prn severe pain 20 tablet 0   carvedilol  (COREG ) 12.5 MG tablet Take 1 tablet (12.5 mg total) by mouth 2 (two) times daily with a meal. 180 tablet 0   furosemide  (LASIX ) 40 MG tablet Take 1 tablet (40 mg total) by mouth daily. 90 tablet 0   lisinopril  (ZESTRIL ) 10 MG tablet Take 1 tablet (10 mg total) by mouth daily. 90 tablet 0   No facility-administered medications prior to visit.    No Known Allergies  ROS     Objective:    Physical Exam Vitals reviewed.  Constitutional:      General: He is not in acute distress.    Appearance: He is obese. He is not toxic-appearing.  HENT:     Head: Normocephalic and atraumatic.     Mouth/Throat:     Mouth: Mucous membranes are moist.     Pharynx: Oropharynx is clear.  Eyes:     Extraocular Movements: Extraocular movements intact.     Pupils: Pupils are equal, round, and reactive to light.  Cardiovascular:     Rate  and Rhythm: Normal rate and regular rhythm.     Pulses: Normal pulses.     Heart sounds: Normal heart sounds. No murmur heard. Pulmonary:     Effort: Pulmonary effort is normal. No respiratory distress.     Breath sounds: Normal breath sounds. No wheezing.  Musculoskeletal:        General: No swelling.     Cervical back: Neck supple.  Skin:    General: Skin is warm and dry.  Neurological:     General: No focal deficit present.     Mental Status: He is alert and oriented to person, place, and time.  Psychiatric:        Mood and Affect: Mood normal.        Behavior: Behavior normal.        Thought Content: Thought content normal.        Judgment: Judgment normal.      BP (!) 163/111   Pulse 89   Ht 6' (1.829 m)   Wt 299 lb (135.6 kg)   SpO2 97%   BMI 40.55 kg/m  Wt Readings from Last 3 Encounters:  05/13/24 299 lb (135.6 kg)  10/16/23 281 lb (127.5 kg)  10/09/23 281 lb (127.5 kg)       Assessment & Plan:   Problem List Items Addressed This Visit       Cardiovascular and Mediastinum   Chronic systolic heart failure (HCC)   Initially dx 7/22 on TEE, last TEE 11/23 demonstrated LVEF of 20-25%.  Pt appears euvolemic today despite being off of his medication for 1 week.  Intermittent dyspnea noted. Emphasized medication adherence to prevent exacerbation. - Referred to cardiology for further evaluation and management. - Encouraged adherence to medications       Relevant Medications   carvedilol  (COREG ) 12.5 MG tablet   furosemide  (LASIX ) 40 MG tablet   lisinopril  (ZESTRIL ) 20 MG tablet   Other Relevant Orders   Comp Met (CMET)   Ambulatory referral to Cardiology   Basic Metabolic Panel (BMET)   Primary hypertension - Primary   Blood pressure is not at goal for age and co-morbidities. - BP goal <130/80 - monitor and log blood pressures at home - check around the same time each day in a relaxed setting - Limit salt to <2000 mg/day - Follow DASH eating plan  (heart healthy diet) - limit alcohol to 2 standard drinks per day for men and 1 per day for women - avoid tobacco products - get at least 2 hours of regular aerobic exercise weekly Patient aware of signs/symptoms requiring further/urgent evaluation. Labs updated today. -Increase lisinopril  to 20 mg daily, FU in 2 weeks for BP recheck and repeat BMP       Relevant Medications   carvedilol  (COREG ) 12.5 MG tablet   furosemide  (LASIX ) 40 MG tablet   lisinopril  (ZESTRIL ) 20 MG tablet   Other Relevant Orders   CBC   Basic Metabolic Panel (BMET)     Musculoskeletal and Integument   Chronic gout of right knee   Recent flare managed with colchicine .  -Uric acid goal <6, Check uric acid level. - Restart allopurinol  100 mg daily - Continue colchicine  as needed for acute flares.      Relevant Orders   Uric acid   Hepatic function panel     Other   Morbid obesity (HCC)   Encouraged DASH or MIND diet, decrease po intake and increase exercise as tolerated. Needs 7-8 hours of sleep nightly. Avoid trans fats, eat small, frequent meals every 4-5 hours with lean proteins, complex carbs and healthy fats. Minimize simple carbs, high fat foods and processed foods        Prediabetes   hgba1c acceptable, minimize simple carbs. Increase exercise as tolerated.       Relevant  Orders   Hepatic function panel   HgB A1c   Tobacco use   Current daily smoker of black and white cigars since 2006. . - Encouraged smoking cessation and offered support and resources. Pt declines at this time.       FU in 2 weeks for nurse visit, BP recheck and repeat BMP  FU 3 months OV   I have discontinued Brand Lamons's lisinopril . I am also having him start on lisinopril . Additionally, I am having him maintain his magnesium oxide, traMADol , allopurinol , colchicine , carvedilol , and furosemide .  Meds ordered this encounter  Medications   carvedilol  (COREG ) 12.5 MG tablet    Sig: Take 1 tablet (12.5 mg total) by  mouth 2 (two) times daily with a meal.    Dispense:  180 tablet    Refill:  0   furosemide  (LASIX ) 40 MG tablet    Sig: Take 1 tablet (40 mg total) by mouth daily.    Dispense:  90 tablet    Refill:  0   lisinopril  (ZESTRIL ) 20 MG tablet    Sig: Take 1 tablet (20 mg total) by mouth daily.    Dispense:  90 tablet    Refill:  1    Supervising Provider:   DOMENICA BLACKBIRD A [[id number]]

## 2024-05-13 NOTE — Assessment & Plan Note (Signed)
 Initially dx 7/22 on TEE, last TEE 11/23 demonstrated LVEF of 20-25%.  Pt appears euvolemic today despite being off of his medication for 1 week.  Intermittent dyspnea noted. Emphasized medication adherence to prevent exacerbation. - Referred to cardiology for further evaluation and management. - Encouraged adherence to medications

## 2024-05-13 NOTE — Assessment & Plan Note (Signed)
 Current daily smoker of black and white cigars since 2006. . - Encouraged smoking cessation and offered support and resources. Pt declines at this time.

## 2024-05-13 NOTE — Assessment & Plan Note (Signed)
 Recent flare managed with colchicine .  -Uric acid goal <6, Check uric acid level. - Restart allopurinol  100 mg daily - Continue colchicine  as needed for acute flares.

## 2024-05-13 NOTE — Assessment & Plan Note (Signed)
 Blood pressure is not at goal for age and co-morbidities. - BP goal <130/80 - monitor and log blood pressures at home - check around the same time each day in a relaxed setting - Limit salt to <2000 mg/day - Follow DASH eating plan (heart healthy diet) - limit alcohol to 2 standard drinks per day for men and 1 per day for women - avoid tobacco products - get at least 2 hours of regular aerobic exercise weekly Patient aware of signs/symptoms requiring further/urgent evaluation. Labs updated today. -Increase lisinopril  to 20 mg daily, FU in 2 weeks for BP recheck and repeat BMP

## 2024-05-14 ENCOUNTER — Ambulatory Visit: Payer: Self-pay | Admitting: Student

## 2024-05-14 ENCOUNTER — Encounter: Payer: Self-pay | Admitting: Student

## 2024-05-14 DIAGNOSIS — M1A0610 Idiopathic chronic gout, right knee, without tophus (tophi): Secondary | ICD-10-CM

## 2024-05-14 MED ORDER — ALLOPURINOL 100 MG PO TABS: 100.0000 mg | ORAL_TABLET | Freq: Every day | ORAL | 1 refills | Status: AC

## 2024-05-14 NOTE — Progress Notes (Signed)
 Matthew Buchanan,  Overall your labs are stable however, your uric acid is elevated and this can contribute to increased gout flares.  Advise restarting the allopurinol  100 mg daily, this has been sent to your pharmacy. After re-starting this medication please make a lab appointment to recheck your uric acid level.  Goal for uric acid is less than 6 to prevent future flares.  Lab orders have been placed and you may set up this appointment at your convenience.  Have a Gertha Christmas!  Best,  Harlene LITTIE Jolly, NP

## 2024-06-02 ENCOUNTER — Ambulatory Visit
